# Patient Record
Sex: Male | Born: 1955 | Race: White | Hispanic: No | Marital: Married | Smoking: Never smoker
Health system: Southern US, Community
[De-identification: ages and names within clinical notes are randomized; demographics above are authoritative.]

## PROBLEM LIST (undated history)

## (undated) DIAGNOSIS — Z87442 Personal history of urinary calculi: Secondary | ICD-10-CM

## (undated) DIAGNOSIS — M1990 Unspecified osteoarthritis, unspecified site: Secondary | ICD-10-CM

## (undated) DIAGNOSIS — T7840XA Allergy, unspecified, initial encounter: Secondary | ICD-10-CM

## (undated) DIAGNOSIS — K219 Gastro-esophageal reflux disease without esophagitis: Secondary | ICD-10-CM

## (undated) DIAGNOSIS — K859 Acute pancreatitis without necrosis or infection, unspecified: Secondary | ICD-10-CM

## (undated) DIAGNOSIS — T8859XA Other complications of anesthesia, initial encounter: Secondary | ICD-10-CM

## (undated) DIAGNOSIS — I10 Essential (primary) hypertension: Secondary | ICD-10-CM

## (undated) DIAGNOSIS — C4490 Unspecified malignant neoplasm of skin, unspecified: Secondary | ICD-10-CM

## (undated) DIAGNOSIS — G500 Trigeminal neuralgia: Secondary | ICD-10-CM

## (undated) DIAGNOSIS — R519 Headache, unspecified: Secondary | ICD-10-CM

## (undated) HISTORY — DX: Unspecified osteoarthritis, unspecified site: M19.90

## (undated) HISTORY — PX: BUNIONECTOMY: SHX129

## (undated) HISTORY — PX: POSTERIOR FUSION CERVICAL SPINE: SUR628

## (undated) HISTORY — PX: KNEE SURGERY: SHX244

## (undated) HISTORY — DX: Allergy, unspecified, initial encounter: T78.40XA

## (undated) HISTORY — PX: EXCISION MORTON'S NEUROMA: SHX5013

## (undated) HISTORY — PX: SPINE SURGERY: SHX786

## (undated) HISTORY — PX: NECK SURGERY: SHX720

---

## 2000-04-10 ENCOUNTER — Emergency Department (HOSPITAL_COMMUNITY): Admission: EM | Admit: 2000-04-10 | Discharge: 2000-04-10 | Payer: Self-pay | Admitting: Emergency Medicine

## 2000-07-22 ENCOUNTER — Ambulatory Visit (HOSPITAL_COMMUNITY): Admission: RE | Admit: 2000-07-22 | Discharge: 2000-07-22 | Payer: Self-pay | Admitting: Orthopedic Surgery

## 2000-07-22 ENCOUNTER — Encounter: Payer: Self-pay | Admitting: Orthopedic Surgery

## 2000-08-04 ENCOUNTER — Ambulatory Visit (HOSPITAL_COMMUNITY): Admission: RE | Admit: 2000-08-04 | Discharge: 2000-08-04 | Payer: Self-pay | Admitting: Orthopedic Surgery

## 2000-08-04 ENCOUNTER — Encounter: Payer: Self-pay | Admitting: Orthopedic Surgery

## 2000-08-18 ENCOUNTER — Encounter: Payer: Self-pay | Admitting: Orthopedic Surgery

## 2000-08-18 ENCOUNTER — Ambulatory Visit (HOSPITAL_COMMUNITY): Admission: RE | Admit: 2000-08-18 | Discharge: 2000-08-18 | Payer: Self-pay | Admitting: Orthopedic Surgery

## 2001-05-29 ENCOUNTER — Encounter: Admission: RE | Admit: 2001-05-29 | Discharge: 2001-05-29 | Payer: Self-pay | Admitting: Orthopedic Surgery

## 2001-05-29 ENCOUNTER — Encounter: Payer: Self-pay | Admitting: Orthopedic Surgery

## 2001-08-15 ENCOUNTER — Encounter: Payer: Self-pay | Admitting: Orthopedic Surgery

## 2001-08-15 ENCOUNTER — Encounter: Admission: RE | Admit: 2001-08-15 | Discharge: 2001-08-15 | Payer: Self-pay | Admitting: Orthopedic Surgery

## 2001-09-04 ENCOUNTER — Encounter: Payer: Self-pay | Admitting: Orthopedic Surgery

## 2001-09-04 ENCOUNTER — Encounter: Admission: RE | Admit: 2001-09-04 | Discharge: 2001-09-04 | Payer: Self-pay | Admitting: Orthopedic Surgery

## 2001-10-17 ENCOUNTER — Encounter: Admission: RE | Admit: 2001-10-17 | Discharge: 2001-10-17 | Payer: Self-pay | Admitting: Orthopedic Surgery

## 2001-10-17 ENCOUNTER — Encounter: Payer: Self-pay | Admitting: Orthopedic Surgery

## 2002-06-28 ENCOUNTER — Encounter: Admission: RE | Admit: 2002-06-28 | Discharge: 2002-06-28 | Payer: Self-pay | Admitting: Radiology

## 2002-06-28 ENCOUNTER — Encounter: Payer: Self-pay | Admitting: Radiology

## 2002-07-12 ENCOUNTER — Encounter: Admission: RE | Admit: 2002-07-12 | Discharge: 2002-07-12 | Payer: Self-pay | Admitting: Radiology

## 2002-07-12 ENCOUNTER — Encounter: Payer: Self-pay | Admitting: Radiology

## 2002-11-06 ENCOUNTER — Encounter: Payer: Self-pay | Admitting: Neurosurgery

## 2002-11-06 ENCOUNTER — Ambulatory Visit (HOSPITAL_COMMUNITY): Admission: RE | Admit: 2002-11-06 | Discharge: 2002-11-07 | Payer: Self-pay | Admitting: Neurosurgery

## 2013-09-06 ENCOUNTER — Other Ambulatory Visit: Payer: Self-pay | Admitting: Family Medicine

## 2013-09-06 ENCOUNTER — Ambulatory Visit
Admission: RE | Admit: 2013-09-06 | Discharge: 2013-09-06 | Disposition: A | Discharge status: Home / Self Care | Payer: BC Managed Care – PPO | Source: Ambulatory Visit | Attending: Family Medicine | Admitting: Family Medicine

## 2013-09-06 DIAGNOSIS — M542 Cervicalgia: Secondary | ICD-10-CM

## 2013-09-06 DIAGNOSIS — M79601 Pain in right arm: Secondary | ICD-10-CM

## 2014-03-22 ENCOUNTER — Other Ambulatory Visit: Payer: Self-pay | Admitting: Neurosurgery

## 2014-03-22 DIAGNOSIS — M5412 Radiculopathy, cervical region: Secondary | ICD-10-CM

## 2014-03-25 ENCOUNTER — Other Ambulatory Visit: Payer: Self-pay | Admitting: Neurosurgery

## 2014-03-25 DIAGNOSIS — M5412 Radiculopathy, cervical region: Secondary | ICD-10-CM

## 2015-08-13 ENCOUNTER — Encounter (HOSPITAL_BASED_OUTPATIENT_CLINIC_OR_DEPARTMENT_OTHER): Payer: Self-pay | Admitting: *Deleted

## 2015-08-13 ENCOUNTER — Emergency Department (HOSPITAL_BASED_OUTPATIENT_CLINIC_OR_DEPARTMENT_OTHER): Payer: BLUE CROSS/BLUE SHIELD

## 2015-08-13 ENCOUNTER — Inpatient Hospital Stay (HOSPITAL_BASED_OUTPATIENT_CLINIC_OR_DEPARTMENT_OTHER)
Admission: EM | Admit: 2015-08-13 | Discharge: 2015-08-17 | DRG: 439 | Disposition: A | Discharge status: Home / Self Care | Payer: BLUE CROSS/BLUE SHIELD | Attending: Internal Medicine | Admitting: Internal Medicine

## 2015-08-13 DIAGNOSIS — M62830 Muscle spasm of back: Secondary | ICD-10-CM | POA: Diagnosis present

## 2015-08-13 DIAGNOSIS — K219 Gastro-esophageal reflux disease without esophagitis: Secondary | ICD-10-CM | POA: Diagnosis present

## 2015-08-13 DIAGNOSIS — R739 Hyperglycemia, unspecified: Secondary | ICD-10-CM | POA: Diagnosis present

## 2015-08-13 DIAGNOSIS — Z981 Arthrodesis status: Secondary | ICD-10-CM

## 2015-08-13 DIAGNOSIS — Z881 Allergy status to other antibiotic agents status: Secondary | ICD-10-CM

## 2015-08-13 DIAGNOSIS — M109 Gout, unspecified: Secondary | ICD-10-CM | POA: Diagnosis present

## 2015-08-13 DIAGNOSIS — I10 Essential (primary) hypertension: Secondary | ICD-10-CM | POA: Diagnosis present

## 2015-08-13 DIAGNOSIS — K859 Acute pancreatitis without necrosis or infection, unspecified: Secondary | ICD-10-CM | POA: Diagnosis not present

## 2015-08-13 DIAGNOSIS — Z85828 Personal history of other malignant neoplasm of skin: Secondary | ICD-10-CM

## 2015-08-13 DIAGNOSIS — K769 Liver disease, unspecified: Secondary | ICD-10-CM | POA: Diagnosis present

## 2015-08-13 DIAGNOSIS — E871 Hypo-osmolality and hyponatremia: Secondary | ICD-10-CM | POA: Diagnosis present

## 2015-08-13 DIAGNOSIS — Z880 Allergy status to penicillin: Secondary | ICD-10-CM

## 2015-08-13 DIAGNOSIS — R252 Cramp and spasm: Secondary | ICD-10-CM | POA: Diagnosis present

## 2015-08-13 HISTORY — DX: Unspecified malignant neoplasm of skin, unspecified: C44.90

## 2015-08-13 HISTORY — DX: Essential (primary) hypertension: I10

## 2015-08-13 HISTORY — DX: Gastro-esophageal reflux disease without esophagitis: K21.9

## 2015-08-13 LAB — CBC WITH DIFFERENTIAL/PLATELET
BASOS PCT: 0 %
Basophils Absolute: 0 10*3/uL (ref 0.0–0.1)
EOS ABS: 0.5 10*3/uL (ref 0.0–0.7)
Eosinophils Relative: 4 %
HCT: 44.1 % (ref 39.0–52.0)
HEMOGLOBIN: 14.9 g/dL (ref 13.0–17.0)
Lymphocytes Relative: 21 %
Lymphs Abs: 2.3 10*3/uL (ref 0.7–4.0)
MCH: 30.3 pg (ref 26.0–34.0)
MCHC: 33.8 g/dL (ref 30.0–36.0)
MCV: 89.8 fL (ref 78.0–100.0)
Monocytes Absolute: 0.9 10*3/uL (ref 0.1–1.0)
Monocytes Relative: 8 %
NEUTROS PCT: 67 %
Neutro Abs: 7.3 10*3/uL (ref 1.7–7.7)
Platelets: 205 10*3/uL (ref 150–400)
RBC: 4.91 MIL/uL (ref 4.22–5.81)
RDW: 13.1 % (ref 11.5–15.5)
WBC: 11 10*3/uL — AB (ref 4.0–10.5)

## 2015-08-13 LAB — COMPREHENSIVE METABOLIC PANEL
ALK PHOS: 75 U/L (ref 38–126)
ALT: 23 U/L (ref 17–63)
ANION GAP: 7 (ref 5–15)
AST: 24 U/L (ref 15–41)
Albumin: 4.3 g/dL (ref 3.5–5.0)
BILIRUBIN TOTAL: 1.2 mg/dL (ref 0.3–1.2)
BUN: 21 mg/dL — ABNORMAL HIGH (ref 6–20)
CALCIUM: 8.9 mg/dL (ref 8.9–10.3)
CO2: 26 mmol/L (ref 22–32)
Chloride: 99 mmol/L — ABNORMAL LOW (ref 101–111)
Creatinine, Ser: 1.07 mg/dL (ref 0.61–1.24)
Glucose, Bld: 144 mg/dL — ABNORMAL HIGH (ref 65–99)
Potassium: 3.3 mmol/L — ABNORMAL LOW (ref 3.5–5.1)
SODIUM: 132 mmol/L — AB (ref 135–145)
TOTAL PROTEIN: 7 g/dL (ref 6.5–8.1)

## 2015-08-13 LAB — LIPASE, BLOOD: LIPASE: 976 U/L — AB (ref 11–51)

## 2015-08-13 LAB — ETHANOL

## 2015-08-13 MED ORDER — FENTANYL CITRATE (PF) 100 MCG/2ML IJ SOLN
50.0000 ug | Freq: Once | INTRAMUSCULAR | Status: AC
  Administered 2015-08-13: 50 ug via INTRAVENOUS
  Filled 2015-08-13: qty 2

## 2015-08-13 MED ORDER — ONDANSETRON HCL 4 MG/2ML IJ SOLN
4.0000 mg | Freq: Once | INTRAMUSCULAR | Status: AC
  Administered 2015-08-13: 4 mg via INTRAVENOUS
  Filled 2015-08-13: qty 2

## 2015-08-13 MED ORDER — SODIUM CHLORIDE 0.9 % IV BOLUS (SEPSIS)
500.0000 mL | Freq: Once | INTRAVENOUS | Status: AC
Start: 2015-08-13 — End: 2015-08-13
  Administered 2015-08-13: 500 mL via INTRAVENOUS

## 2015-08-13 NOTE — ED Notes (Signed)
Pt ambulated to restroom with no assistance needed.  Pt c/o pain , no acute distress noted

## 2015-08-13 NOTE — ED Notes (Signed)
MD at bedside. 

## 2015-08-13 NOTE — ED Provider Notes (Signed)
CSN: [id number]     Arrival date & time 08/13/15  2016 History   By signing my name below, I, Erling Conte, attest that this documentation has been prepared under the direction and in the presence of Davonna Belling, MD. Electronically Signed: Erling Conte, ED Scribe. 08/14/2015. 12:08 AM.    Chief Complaint  Patient presents with  . Chest Pain   The history is provided by the patient. No language interpreter was used.    HPI Comments: Corey Hawkins is a 60 y.o. male with a h/o GERD who presents to the Emergency Department complaining of intermittent, moderate, sharp, left sided chest pain onset yesterday that began to gradually worsen today. Pt notes the pain radiates to his back. He reports associated nausea, LUQ abdominal pain, abdominal distension and dry cough. He has not taken anything prior to arrival for his symptoms. Pt states the pain is exacerbated with eating and certain movements. He denies any h/o gallstones. He notes he drinks 2-4x per week but drinks light beer. He denies any h/o ETOH abuse. He denies any fevers, chills, vomiting, diarrhea or trouble breathing    Past Medical History  Diagnosis Date  . GERD (gastroesophageal reflux disease)   . Hypertension   . Skin cancer    Past Surgical History  Procedure Laterality Date  . Knee surgery    . Neck surgery     No family history on file. Social History  Substance Use Topics  . Smoking status: Passive Smoke Exposure - Never Smoker  . Smokeless tobacco: Never Used  . Alcohol Use: Yes     Comment: 2-3 drinks; 4-5 days a week    Review of Systems  Constitutional: Negative for fever.  Respiratory: Negative for shortness of breath.   Cardiovascular: Positive for chest pain.  Gastrointestinal: Positive for nausea, abdominal pain and abdominal distention. Negative for vomiting and diarrhea.      Allergies  Vibramycin and Penicillins  Home Medications   Prior to Admission medications   Medication Sig  Start Date End Date Taking? Authorizing Provider  ALLOPURINOL PO Take by mouth daily.   Yes Historical Provider, MD  co-enzyme Q-10 30 MG capsule Take by mouth.   Yes Historical Provider, MD  lisinopril (PRINIVIL,ZESTRIL) 2.5 MG tablet Take 2.5 mg by mouth daily.   Yes Historical Provider, MD  Omega-3 Fatty Acids (FISH OIL PO) Take by mouth.   Yes Historical Provider, MD  omeprazole (PRILOSEC) 20 MG capsule Take 20 mg by mouth daily.   Yes Historical Provider, MD   Triage Vitals: BP 114/78 mmHg  Pulse 67  Temp(Src) 98.1 F (36.7 C) (Oral)  Resp 20  Ht 5\' 10"  (1.778 m)  Wt 211 lb (95.709 kg)  BMI 30.28 kg/m2  SpO2 98%  Physical Exam  Constitutional: He is oriented to person, place, and time. He appears well-developed and well-nourished. No distress.  HENT:  Head: Normocephalic and atraumatic.  Eyes: Conjunctivae and EOM are normal.  Neck: Neck supple. No tracheal deviation present.  Cardiovascular: Normal rate, regular rhythm and normal heart sounds.   Pulmonary/Chest: Effort normal. No respiratory distress.  Abdominal: Soft. He exhibits no distension and no mass. There is tenderness in the epigastric area and left upper quadrant. There is CVA tenderness (mild posterior to mid back tenderness). There is no rebound and no guarding.  Musculoskeletal: Normal range of motion.  Neurological: He is alert and oriented to person, place, and time.  Skin: Skin is warm and dry.  Psychiatric: He has  a normal mood and affect. His behavior is normal.  Nursing note and vitals reviewed.   ED Course  Procedures (including critical care time)  Results for orders placed or performed during the hospital encounter of 08/13/15  Comprehensive metabolic panel  Result Value Ref Range   Sodium 132 (L) 135 - 145 mmol/L   Potassium 3.3 (L) 3.5 - 5.1 mmol/L   Chloride 99 (L) 101 - 111 mmol/L   CO2 26 22 - 32 mmol/L   Glucose, Bld 144 (H) 65 - 99 mg/dL   BUN 21 (H) 6 - 20 mg/dL   Creatinine, Ser 1.07  0.61 - 1.24 mg/dL   Calcium 8.9 8.9 - 10.3 mg/dL   Total Protein 7.0 6.5 - 8.1 g/dL   Albumin 4.3 3.5 - 5.0 g/dL   AST 24 15 - 41 U/L   ALT 23 17 - 63 U/L   Alkaline Phosphatase 75 38 - 126 U/L   Total Bilirubin 1.2 0.3 - 1.2 mg/dL   GFR calc non Af Amer >60 >60 mL/min   GFR calc Af Amer >60 >60 mL/min   Anion gap 7 5 - 15  Lipase, blood  Result Value Ref Range   Lipase 976 (H) 11 - 51 U/L  CBC with Differential  Result Value Ref Range   WBC 11.0 (H) 4.0 - 10.5 K/uL   RBC 4.91 4.22 - 5.81 MIL/uL   Hemoglobin 14.9 13.0 - 17.0 g/dL   HCT 44.1 39.0 - 52.0 %   MCV 89.8 78.0 - 100.0 fL   MCH 30.3 26.0 - 34.0 pg   MCHC 33.8 30.0 - 36.0 g/dL   RDW 13.1 11.5 - 15.5 %   Platelets 205 150 - 400 K/uL   Neutrophils Relative % 67 %   Neutro Abs 7.3 1.7 - 7.7 K/uL   Lymphocytes Relative 21 %   Lymphs Abs 2.3 0.7 - 4.0 K/uL   Monocytes Relative 8 %   Monocytes Absolute 0.9 0.1 - 1.0 K/uL   Eosinophils Relative 4 %   Eosinophils Absolute 0.5 0.0 - 0.7 K/uL   Basophils Relative 0 %   Basophils Absolute 0.0 0.0 - 0.1 K/uL  Ethanol  Result Value Ref Range   Alcohol, Ethyl (B) <5 <5 mg/dL   Dg Chest 2 View  08/13/2015  CLINICAL DATA:  Acute onset of left-sided lower chest pain and shortness of breath. Initial encounter. EXAM: CHEST  2 VIEW COMPARISON:  None. FINDINGS: The lungs are well-aerated and clear. There is no evidence of focal opacification, pleural effusion or pneumothorax. The heart is normal in size; the mediastinal contour is within normal limits. No acute osseous abnormalities are seen. Cervical spinal fusion hardware is noted. IMPRESSION: No acute cardiopulmonary process seen. Electronically Signed   By: Garald Balding M.D.   On: 08/13/2015 22:16    I have personally reviewed and evaluated these images and lab results as part of my medical decision-making.   EKG Interpretation   Date/Time:  Wednesday August 13 2015 20:22:45 EST Ventricular Rate:  73 PR Interval:  156 QRS  Duration: 84 QT Interval:  400 QTC Calculation: 440 R Axis:   -14 Text Interpretation:  Normal sinus rhythm Nonspecific ST and T wave  abnormality Abnormal ECG Confirmed by Alvino Chapel  MD, Ovid Curd [phone]) on  08/13/2015 9:00:05 PM      MDM   Final diagnoses:  Acute pancreatitis, unspecified pancreatitis type    Patient with abdominal pain with radiation to chest. NV. New onset pancreatitis. Likely  has had previous episodes. Continued pain. Will admit.  I personally performed the services described in this documentation, which was scribed in my presence. The recorded information has been reviewed and is accurate.       Davonna Belling, MD 08/14/15 [phone]

## 2015-08-13 NOTE — ED Notes (Signed)
Pt reports sharp left side chest pain since yesterday morning; worse today; nausea; feels like "knot in throat"

## 2015-08-14 ENCOUNTER — Inpatient Hospital Stay (HOSPITAL_COMMUNITY): Payer: BLUE CROSS/BLUE SHIELD

## 2015-08-14 ENCOUNTER — Inpatient Hospital Stay (HOSPITAL_BASED_OUTPATIENT_CLINIC_OR_DEPARTMENT_OTHER): Payer: BLUE CROSS/BLUE SHIELD

## 2015-08-14 ENCOUNTER — Encounter (HOSPITAL_COMMUNITY): Payer: Self-pay

## 2015-08-14 DIAGNOSIS — R739 Hyperglycemia, unspecified: Secondary | ICD-10-CM | POA: Diagnosis present

## 2015-08-14 DIAGNOSIS — M109 Gout, unspecified: Secondary | ICD-10-CM

## 2015-08-14 DIAGNOSIS — K7689 Other specified diseases of liver: Secondary | ICD-10-CM | POA: Diagnosis not present

## 2015-08-14 DIAGNOSIS — K219 Gastro-esophageal reflux disease without esophagitis: Secondary | ICD-10-CM | POA: Diagnosis present

## 2015-08-14 DIAGNOSIS — E871 Hypo-osmolality and hyponatremia: Secondary | ICD-10-CM

## 2015-08-14 DIAGNOSIS — I10 Essential (primary) hypertension: Secondary | ICD-10-CM | POA: Diagnosis present

## 2015-08-14 DIAGNOSIS — R252 Cramp and spasm: Secondary | ICD-10-CM | POA: Diagnosis present

## 2015-08-14 DIAGNOSIS — K769 Liver disease, unspecified: Secondary | ICD-10-CM | POA: Diagnosis present

## 2015-08-14 DIAGNOSIS — Z85828 Personal history of other malignant neoplasm of skin: Secondary | ICD-10-CM | POA: Diagnosis not present

## 2015-08-14 DIAGNOSIS — Z981 Arthrodesis status: Secondary | ICD-10-CM | POA: Diagnosis not present

## 2015-08-14 DIAGNOSIS — M62830 Muscle spasm of back: Secondary | ICD-10-CM | POA: Diagnosis not present

## 2015-08-14 DIAGNOSIS — K859 Acute pancreatitis without necrosis or infection, unspecified: Secondary | ICD-10-CM | POA: Diagnosis present

## 2015-08-14 DIAGNOSIS — Z880 Allergy status to penicillin: Secondary | ICD-10-CM | POA: Diagnosis not present

## 2015-08-14 DIAGNOSIS — Z881 Allergy status to other antibiotic agents status: Secondary | ICD-10-CM | POA: Diagnosis not present

## 2015-08-14 MED ORDER — THIAMINE HCL 100 MG/ML IJ SOLN
100.0000 mg | Freq: Every day | INTRAMUSCULAR | Status: DC
  Administered 2015-08-15: 100 mg via INTRAVENOUS
  Filled 2015-08-14: qty 2

## 2015-08-14 MED ORDER — HYDROMORPHONE HCL 1 MG/ML IJ SOLN
1.0000 mg | INTRAMUSCULAR | Status: DC | PRN
  Administered 2015-08-14: 1 mg via INTRAVENOUS
  Filled 2015-08-14: qty 1

## 2015-08-14 MED ORDER — SODIUM CHLORIDE 0.9 % IV SOLN
INTRAVENOUS | Status: DC
  Administered 2015-08-14 – 2015-08-15 (×2): via INTRAVENOUS

## 2015-08-14 MED ORDER — HYDROMORPHONE HCL 1 MG/ML IJ SOLN
1.0000 mg | Freq: Once | INTRAMUSCULAR | Status: AC
  Administered 2015-08-14: 1 mg via INTRAVENOUS
  Filled 2015-08-14: qty 1

## 2015-08-14 MED ORDER — ADULT MULTIVITAMIN W/MINERALS CH: 1.0000 | ORAL_TABLET | Freq: Every day | ORAL | Status: DC

## 2015-08-14 MED ORDER — HYDRALAZINE HCL 20 MG/ML IJ SOLN: 5.0000 mg | INTRAMUSCULAR | Status: DC | PRN

## 2015-08-14 MED ORDER — PANTOPRAZOLE SODIUM 40 MG IV SOLR
40.0000 mg | Freq: Two times a day (BID) | INTRAVENOUS | Status: DC
  Administered 2015-08-14 – 2015-08-15 (×3): 40 mg via INTRAVENOUS
  Filled 2015-08-14 (×3): qty 40

## 2015-08-14 MED ORDER — IOHEXOL 300 MG/ML  SOLN
25.0000 mL | Freq: Once | INTRAMUSCULAR | Status: AC | PRN
  Administered 2015-08-14: 25 mL via ORAL

## 2015-08-14 MED ORDER — DEXTROSE-NACL 5-0.45 % IV SOLN
INTRAVENOUS | Status: AC
  Administered 2015-08-14: 06:00:00 via INTRAVENOUS

## 2015-08-14 MED ORDER — FOLIC ACID 1 MG PO TABS
1.0000 mg | ORAL_TABLET | Freq: Every day | ORAL | Status: DC
  Administered 2015-08-16: 1 mg via ORAL
  Filled 2015-08-14: qty 1

## 2015-08-14 MED ORDER — PROMETHAZINE HCL 25 MG PO TABS: 12.5000 mg | ORAL_TABLET | Freq: Four times a day (QID) | ORAL | Status: DC | PRN

## 2015-08-14 MED ORDER — PANTOPRAZOLE SODIUM 40 MG IV SOLR
40.0000 mg | INTRAVENOUS | Status: DC
  Administered 2015-08-14: 40 mg via INTRAVENOUS
  Filled 2015-08-14 (×2): qty 40

## 2015-08-14 MED ORDER — HYDROMORPHONE HCL 1 MG/ML IJ SOLN
1.0000 mg | INTRAMUSCULAR | Status: DC | PRN
  Administered 2015-08-14 (×3): 1 mg via INTRAVENOUS
  Administered 2015-08-15 (×2): 0.5 mg via INTRAVENOUS
  Administered 2015-08-15: 1 mg via INTRAVENOUS
  Administered 2015-08-15 (×2): 0.5 mg via INTRAVENOUS
  Administered 2015-08-15 – 2015-08-16 (×3): 1 mg via INTRAVENOUS
  Filled 2015-08-14 (×7): qty 1

## 2015-08-14 MED ORDER — GI COCKTAIL ~~LOC~~: 30.0000 mL | Freq: Two times a day (BID) | ORAL | Status: DC | PRN

## 2015-08-14 MED ORDER — LORAZEPAM 1 MG PO TABS: 1.0000 mg | ORAL_TABLET | Freq: Four times a day (QID) | ORAL | Status: DC | PRN

## 2015-08-14 MED ORDER — IOHEXOL 300 MG/ML  SOLN
100.0000 mL | Freq: Once | INTRAMUSCULAR | Status: AC | PRN
  Administered 2015-08-14: 100 mL via INTRAVENOUS

## 2015-08-14 MED ORDER — ONDANSETRON HCL 4 MG/2ML IJ SOLN
8.0000 mg | Freq: Once | INTRAMUSCULAR | Status: AC
  Administered 2015-08-14: 8 mg via INTRAVENOUS
  Filled 2015-08-14: qty 4

## 2015-08-14 MED ORDER — LORAZEPAM 2 MG/ML IJ SOLN: 1.0000 mg | Freq: Four times a day (QID) | INTRAMUSCULAR | Status: DC | PRN

## 2015-08-14 MED ORDER — VITAMIN B-1 100 MG PO TABS
100.0000 mg | ORAL_TABLET | Freq: Every day | ORAL | Status: DC
  Administered 2015-08-16: 100 mg via ORAL
  Filled 2015-08-14: qty 1

## 2015-08-14 MED ORDER — ENOXAPARIN SODIUM 40 MG/0.4ML ~~LOC~~ SOLN
40.0000 mg | SUBCUTANEOUS | Status: DC
  Administered 2015-08-14 – 2015-08-16 (×3): 40 mg via SUBCUTANEOUS
  Filled 2015-08-14 (×3): qty 0.4

## 2015-08-14 NOTE — Plan of Care (Signed)
60 year old male with history of Hypertension presents with abdominal pain. Has elevated lipase. Has acute pancreatitis. Cause ? Alcohol. CT pending.  Corey Hawkins.

## 2015-08-14 NOTE — ED Notes (Signed)
Attempted to call report, needs to call me back

## 2015-08-14 NOTE — Progress Notes (Signed)
Paged MD for admission orders. Payton Emerald, RN

## 2015-08-14 NOTE — Progress Notes (Signed)
Patient arrived via [facility]. Oriented to room, not in any distress at this time. Is requesting Dilaudid to "stay ahead of the pain." Oriented to room, patient placed on heart monitor, Dr notified of patient's arrival, call light within reach.

## 2015-08-14 NOTE — H&P (Addendum)
[facility] History and Physical  LAKIN RAMSOUR [id number] DOB: Aug 05, 1956 DOA: 08/13/2015  Referring physician: Dr. Corrie Dandy PCP: Cleotis Nipper, MD   Chief Complaint: CP   HPI: Corey Hawkins is a 60 y.o. male  Upper abdominal and lower chest pain. Sharp. Radiates to the back. Initially intermittent but now constant. Started 1-2 days ago. Associated with nausea, dry cough and abdominal distention. Patient is not tried anything for her symptoms. Pain is getting worse. Worse with eating and certain movements. No history of gallstones. Drinks approximately 4 nights per week and will drink 2-3 beers on each of those nights. Denies any cardiac type chest pain, palpitations, shortness of breath, LOC, neck stiffness, fevers, dysuria, constipation, diarrhea.  Review of Systems:  Constitutional:  No weight loss, night sweats, Fevers,.  HEENT:  No headaches, Difficulty swallowing,Tooth/dental problems,Sore throat, Cardio-vascular:  No chest pain, Orthopnea, PND, swelling in lower extremities, anasarca, dizziness, palpitations  GI: Per HPI Resp:   No shortness of breath with exertion or at rest. No excess mucus, no productive cough, No non-productive cough, No coughing up of blood.No change in color of mucus.No wheezing.No chest wall deformity  Skin:  no rash or lesions.  GU:  no dysuria, change in color of urine, no urgency or frequency. No flank pain.  Musculoskeletal:   No joint pain or swelling. No decreased range of motion. No back pain.  Psych:  No change in mood or affect. No depression or anxiety. No memory loss.  Neuro:  No change in sensation, unilateral strength, or cognitive abilities  All other systems were reviewed and are negative.  Past Medical History  Diagnosis Date  . GERD (gastroesophageal reflux disease)   . Hypertension   . Skin cancer    Past Surgical History  Procedure Laterality Date  . Knee surgery    . Neck surgery     Social History:   reports that he has been passively smoking.  He has never used smokeless tobacco. He reports that he drinks alcohol. He reports that he does not use illicit drugs.  Allergies  Allergen Reactions  . Vibramycin [Doxycycline Hyclate] Other (See Comments)    All "mycins"; cause mouth sores  . Penicillins Rash    "all 'cillins"    Family History  Problem Relation Age of Onset  . Gallbladder disease Mother   . Gallbladder disease Father   . Gallbladder disease Brother      Prior to Admission medications   Medication Sig Start Date End Date Taking? Authorizing Provider  ALLOPURINOL PO Take by mouth daily.   Yes Historical Provider, MD  co-enzyme Q-10 30 MG capsule Take by mouth.   Yes Historical Provider, MD  lisinopril (PRINIVIL,ZESTRIL) 2.5 MG tablet Take 2.5 mg by mouth daily.   Yes Historical Provider, MD  Omega-3 Fatty Acids (FISH OIL PO) Take by mouth.   Yes Historical Provider, MD  omeprazole (PRILOSEC) 20 MG capsule Take 20 mg by mouth daily.   Yes Historical Provider, MD   Physical Exam: Filed Vitals:   08/13/15 2300 08/14/15 0008 08/14/15 0030 08/14/15 0230  BP: 127/81 120/73 124/82 129/81  Pulse: 64 66 75 65  Temp:    97.5 F (36.4 C)  TempSrc:    Oral  Resp: 16 18  18   Height:    5\' 11"  (1.803 m)  Weight:    98.3 kg (216 lb 11.4 oz)  SpO2: 98% 98% 94% 95%    Wt Readings from Last 3 Encounters:  08/14/15  98.3 kg (216 lb 11.4 oz)    General:  Appears calm and comfortable Eyes:  PERRL, EOMI, normal lids, iris ENT:  grossly normal hearing, lips & tongue Neck:  no LAD, masses or thyromegaly Cardiovascular:  RRR, no m/r/g. No LE edema.  Respiratory:  CTA bilaterally, no w/r/r. Normal respiratory effort. Abdomen:  Mild diffuse upper abdominal pain, hypoactive bowel sounds, nondistended. Skin:  no rash or induration seen on limited exam Musculoskeletal:  grossly normal tone BUE/BLE Psychiatric:  grossly normal mood and affect, speech fluent and  appropriate Neurologic:  CN 2-12 grossly intact, moves all extremities in coordinated fashion.          Labs on Admission:  Basic Metabolic Panel:  Recent Labs Lab 08/13/15 2200  NA 132*  K 3.3*  CL 99*  CO2 26  GLUCOSE 144*  BUN 21*  CREATININE 1.07  CALCIUM 8.9   Liver Function Tests:  Recent Labs Lab 08/13/15 2200  AST 24  ALT 23  ALKPHOS 75  BILITOT 1.2  PROT 7.0  ALBUMIN 4.3    Recent Labs Lab 08/13/15 2200  LIPASE 976*   No results for input(s): AMMONIA in the last 168 hours. CBC:  Recent Labs Lab 08/13/15 2200  WBC 11.0*  NEUTROABS 7.3  HGB 14.9  HCT 44.1  MCV 89.8  PLT 205   Cardiac Enzymes: No results for input(s): CKTOTAL, CKMB, CKMBINDEX, TROPONINI in the last 168 hours.  BNP (last 3 results) No results for input(s): BNP in the last 8760 hours.  ProBNP (last 3 results) No results for input(s): PROBNP in the last 8760 hours.   CREATININE: 1.07 (08/13/15 2200) Estimated creatinine clearance - 87.7 mL/min  CBG: No results for input(s): GLUCAP in the last 168 hours.  Radiological Exams on Admission: Dg Chest 2 View  08/13/2015  CLINICAL DATA:  Acute onset of left-sided lower chest pain and shortness of breath. Initial encounter. EXAM: CHEST  2 VIEW COMPARISON:  None. FINDINGS: The lungs are well-aerated and clear. There is no evidence of focal opacification, pleural effusion or pneumothorax. The heart is normal in size; the mediastinal contour is within normal limits. No acute osseous abnormalities are seen. Cervical spinal fusion hardware is noted. IMPRESSION: No acute cardiopulmonary process seen. Electronically Signed   By: Garald Balding M.D.   On: 08/13/2015 22:16   Ct Abdomen Pelvis W Contrast  08/14/2015  CLINICAL DATA:  Acute onset of left-sided chest pain and left upper quadrant abdominal pain. Nausea, constipation and elevated lipase. Initial encounter. EXAM: CT ABDOMEN AND PELVIS WITH CONTRAST TECHNIQUE: Multidetector CT imaging  of the abdomen and pelvis was performed using the standard protocol following bolus administration of intravenous contrast. CONTRAST:  119mL OMNIPAQUE IOHEXOL 300 MG/ML  SOLN COMPARISON:  None. FINDINGS: The visualized lung bases are clear. There is a vague 4.0 cm hypodensity within the right hepatic lobe. This is not well characterized on the current study. The liver and spleen are otherwise unremarkable. The gallbladder is within normal limits. The adrenal glands are unremarkable. Mild diffuse soft tissue inflammation is noted about the pancreas, compatible with mild acute pancreatitis. There is some degree of sparing of the pancreatic head, and a small amount of fluid is noted tracking inferiorly along Gerota's fascia on the left side. There is no evidence of devascularization or pseudocyst formation at this time. Minimal nonspecific left-sided perinephric stranding is noted. The kidneys are otherwise unremarkable. There is no evidence of hydronephrosis. No renal or ureteral stones are seen. The small bowel  is unremarkable in appearance. The stomach is within normal limits. No acute vascular abnormalities are seen. Mild calcification is noted along the abdominal aorta and its branches. The appendix is normal in caliber, without evidence of appendicitis. The colon is unremarkable in appearance. The bladder is mildly distended and grossly unremarkable in appearance. The prostate remains normal in size. No inguinal lymphadenopathy is seen. No acute osseous abnormalities are identified. IMPRESSION: 1. Mild acute pancreatitis noted, with mild diffuse soft tissue inflammation about the pancreas. Some sparing of the pancreatic head. Small amount of fluid noted tracking inferiorly along Gerota's fascia on the left side. No evidence of devascularization or pseudocyst formation at this time. 2. Vague 4.0 cm hypodensity within the right hepatic lobe. Dynamic liver protocol MRI or CT would be helpful for further evaluation.  3. Mild calcification along the abdominal aorta and branches. Electronically Signed   By: Garald Balding M.D.   On: 08/14/2015 01:20     Assessment/Plan Active Problems:   Pancreatitis   Hepatic lesion   Essential hypertension   GERD (gastroesophageal reflux disease)   Hyponatremia   Hyperglycemia   Gout   Acute pancreatitis: Lipase 976, WBC 11, AFVSS. ETOH <5. CT showing pancreatitis without pseudocyst and possible hypodensity in the liver without evidence of gallstones or biliary dilatation. Suspect EtOH etiology. Strong family history of Cholecystitis/cholecystectomy - MedSurg - Nothing by mouth - IVF - Pain medications, antiemetics - Right upper quadrant ultrasound - Lipid panel - consider GI/Gen surg consult if not improving - CIWA  Hepatic Lesion: 4cm R lobe hypodencity noted. Radiology recommending MRI but pt unable to have MRI due to cervical fusion surgery - RUQ [place]   HTN: unable to take PO. nml range at this time - hold lisinopril until taking PO - Hydralazine PRN  GERD: on prilosec at home - protonix IV  Hyponatremia: 132. Mild  - IVF - BMEt in am  Hyperglycemia: 144. Mild - A1c  Gout: - continue allopurinol when taking PO  Code Status: FULL  DVT Prophylaxis: Lovenox Family Communication: None Disposition Plan: Pending Improvement    Manaia Samad Lenna Sciara, MD Family Medicine [facility] [email or web address] Password [email or web address]

## 2015-08-14 NOTE — Progress Notes (Signed)
Utilization Review Completed.Corey Hawkins T1/12/2015  

## 2015-08-14 NOTE — ED Notes (Signed)
Patient transported to CT 

## 2015-08-15 DIAGNOSIS — K859 Acute pancreatitis without necrosis or infection, unspecified: Principal | ICD-10-CM

## 2015-08-15 DIAGNOSIS — I10 Essential (primary) hypertension: Secondary | ICD-10-CM

## 2015-08-15 DIAGNOSIS — K7689 Other specified diseases of liver: Secondary | ICD-10-CM

## 2015-08-15 LAB — COMPREHENSIVE METABOLIC PANEL
ALBUMIN: 3.2 g/dL — AB (ref 3.5–5.0)
ALK PHOS: 62 U/L (ref 38–126)
ALT: 16 U/L — ABNORMAL LOW (ref 17–63)
ANION GAP: 7 (ref 5–15)
AST: 17 U/L (ref 15–41)
BILIRUBIN TOTAL: 1.8 mg/dL — AB (ref 0.3–1.2)
BUN: 9 mg/dL (ref 6–20)
CALCIUM: 8.4 mg/dL — AB (ref 8.9–10.3)
CO2: 28 mmol/L (ref 22–32)
Chloride: 104 mmol/L (ref 101–111)
Creatinine, Ser: 1.02 mg/dL (ref 0.61–1.24)
GFR calc Af Amer: >60 mL/min (ref >60)
GFR calc non Af Amer: >60 mL/min (ref >60)
GLUCOSE: 93 mg/dL (ref 65–99)
Potassium: 3.7 mmol/L (ref 3.5–5.1)
Sodium: 139 mmol/L (ref 135–145)
TOTAL PROTEIN: 5.5 g/dL — AB (ref 6.5–8.1)

## 2015-08-15 LAB — CBC
HEMATOCRIT: 40 % (ref 39.0–52.0)
HEMOGLOBIN: 13.3 g/dL (ref 13.0–17.0)
MCH: 30.5 pg (ref 26.0–34.0)
MCHC: 33.3 g/dL (ref 30.0–36.0)
MCV: 91.7 fL (ref 78.0–100.0)
Platelets: 166 10*3/uL (ref 150–400)
RBC: 4.36 MIL/uL (ref 4.22–5.81)
RDW: 13.1 % (ref 11.5–15.5)
WBC: 12 10*3/uL — ABNORMAL HIGH (ref 4.0–10.5)

## 2015-08-15 LAB — LIPID PANEL
CHOL/HDL RATIO: 2.6 ratio
Cholesterol: 152 mg/dL (ref 0–200)
HDL: 58 mg/dL (ref >40)
LDL CALC: 80 mg/dL (ref 0–99)
Triglycerides: 68 mg/dL (ref <150)
VLDL: 14 mg/dL (ref 0–40)

## 2015-08-15 LAB — LIPASE, BLOOD: LIPASE: 90 U/L — AB (ref 11–51)

## 2015-08-15 NOTE — Progress Notes (Signed)
[facility] PROGRESS NOTE  Corey Hawkins [id number] DOB: January 13, 1956 DOA: 08/13/2015 PCP: Cleotis Nipper, MD  Assessment/Plan:  Active Problems:   Acute Pancreatitis: [place] without stone. TG normal. Drinks a few beers 4 times a week with occasional mixed drink. ?EtOH related? Symptoms slightly improved. Will try clears.   Hepatic lesion: Unable to characterize on abdominal scan or ultrasound. Have left message with Dr. Harley Hallmark office to see whether he can have MRI of the liver. If not, will order elective dedicated liver CAT scan.   Essential hypertension   GERD (gastroesophageal reflux disease)   Hyponatremia resolved   Hyperglycemia resolved   Gout  Code Status:  full Family Communication:  Wife  Disposition Plan:  Home 1-2 days if improved   Consultants:    Procedures:     Antibiotics:    HPI/Subjective:  abdominal pain remains and radiates to mid back, but overall, improved. Thinks he may have had pancreatitis in the past with similar symptoms but never sought medical assistance. Denies history of what he calls "heavy drinking". See above   Objective: Filed Vitals:   08/14/15 2208 08/15/15 0731  BP: 126/67 135/67  Pulse: 89 84  Temp: 99.7 F (37.6 C) 98.6 F (37 C)  Resp: 18 18   No intake or output data in the 24 hours ending 08/15/15 1356 Filed Weights   08/13/15 2027 08/14/15 0230  Weight: 95.709 kg (211 lb) 98.3 kg (216 lb 11.4 oz)    Exam:   General:   comfortable in chair. Alert oriented talkative.  Cardiovascular:  regular rate rhythm without murmurs gallops rubs   Respiratory:  clear to auscultation bilaterally without wheezes rhonchi or rales   Abdomen: Soft, bowel sounds quiet. Minimal epigastric tenderness. No rebound tenderness   Ext:  right hand slightly edematous. No edema elsewhere.   Basic Metabolic Panel:  Recent Labs Lab 08/13/15 2200 08/15/15 0340  NA 132* 139  K 3.3* 3.7  CL 99* 104  CO2 26 28  GLUCOSE 144* 93   BUN 21* 9  CREATININE 1.07 1.02  CALCIUM 8.9 8.4*   Liver Function Tests:  Recent Labs Lab 08/13/15 2200 08/15/15 0340  AST 24 17  ALT 23 16*  ALKPHOS 75 62  BILITOT 1.2 1.8*  PROT 7.0 5.5*  ALBUMIN 4.3 3.2*    Recent Labs Lab 08/13/15 2200  LIPASE 976*   No results for input(s): AMMONIA in the last 168 hours. CBC:  Recent Labs Lab 08/13/15 2200 08/15/15 0340  WBC 11.0* 12.0*  NEUTROABS 7.3  --   HGB 14.9 13.3  HCT 44.1 40.0  MCV 89.8 91.7  PLT 205 166   Cardiac Enzymes: No results for input(s): CKTOTAL, CKMB, CKMBINDEX, TROPONINI in the last 168 hours. BNP (last 3 results) No results for input(s): BNP in the last 8760 hours.  ProBNP (last 3 results) No results for input(s): PROBNP in the last 8760 hours.  CBG: No results for input(s): GLUCAP in the last 168 hours.  No results found for this or any previous visit (from the past 240 hour(s)).   Studies: Dg Chest 2 View  08/13/2015  CLINICAL DATA:  Acute onset of left-sided lower chest pain and shortness of breath. Initial encounter. EXAM: CHEST  2 VIEW COMPARISON:  None. FINDINGS: The lungs are well-aerated and clear. There is no evidence of focal opacification, pleural effusion or pneumothorax. The heart is normal in size; the mediastinal contour is within normal limits. No acute osseous abnormalities are seen. Cervical spinal fusion  hardware is noted. IMPRESSION: No acute cardiopulmonary process seen. Electronically Signed   By: Garald Balding M.D.   On: 08/13/2015 22:16   Ct Abdomen Pelvis W Contrast  08/14/2015  CLINICAL DATA:  Acute onset of left-sided chest pain and left upper quadrant abdominal pain. Nausea, constipation and elevated lipase. Initial encounter. EXAM: CT ABDOMEN AND PELVIS WITH CONTRAST TECHNIQUE: Multidetector CT imaging of the abdomen and pelvis was performed using the standard protocol following bolus administration of intravenous contrast. CONTRAST:  152mL OMNIPAQUE IOHEXOL 300 MG/ML   SOLN COMPARISON:  None. FINDINGS: The visualized lung bases are clear. There is a vague 4.0 cm hypodensity within the right hepatic lobe. This is not well characterized on the current study. The liver and spleen are otherwise unremarkable. The gallbladder is within normal limits. The adrenal glands are unremarkable. Mild diffuse soft tissue inflammation is noted about the pancreas, compatible with mild acute pancreatitis. There is some degree of sparing of the pancreatic head, and a small amount of fluid is noted tracking inferiorly along Gerota's fascia on the left side. There is no evidence of devascularization or pseudocyst formation at this time. Minimal nonspecific left-sided perinephric stranding is noted. The kidneys are otherwise unremarkable. There is no evidence of hydronephrosis. No renal or ureteral stones are seen. The small bowel is unremarkable in appearance. The stomach is within normal limits. No acute vascular abnormalities are seen. Mild calcification is noted along the abdominal aorta and its branches. The appendix is normal in caliber, without evidence of appendicitis. The colon is unremarkable in appearance. The bladder is mildly distended and grossly unremarkable in appearance. The prostate remains normal in size. No inguinal lymphadenopathy is seen. No acute osseous abnormalities are identified. IMPRESSION: 1. Mild acute pancreatitis noted, with mild diffuse soft tissue inflammation about the pancreas. Some sparing of the pancreatic head. Small amount of fluid noted tracking inferiorly along Gerota's fascia on the left side. No evidence of devascularization or pseudocyst formation at this time. 2. Vague 4.0 cm hypodensity within the right hepatic lobe. Dynamic liver protocol MRI or CT would be helpful for further evaluation. 3. Mild calcification along the abdominal aorta and branches. Electronically Signed   By: Garald Balding M.D.   On: 08/14/2015 01:20   US Abdomen Limited  Ruq  08/14/2015  CLINICAL DATA:  Pancreatitis and liver lesion seen on CT. EXAM: US ABDOMEN LIMITED - RIGHT UPPER QUADRANT COMPARISON:  CT abdomen pelvis August 14, 2015 FINDINGS: Gallbladder: No gallstones or wall thickening visualized. No sonographic Murphy sign noted by sonographer. Common bile duct: Diameter: 4.1 mm Liver: In the right lobe liver, there is an echogenic area measuring 3.1 x 2.3 x 2.3 cm. There is also a hypoechoic area in the right lobe liver measuring 2 x 2.2 x 2.6 cm. This correlates to the previously questioned CT abnormality. IMPRESSION: Hyper and hypoechoic lesions identified in the right lobe liver correlating to the CT finding. Further evaluation with dynamic liver protocol MRI or CT would be helpful. Electronically Signed   By: Abelardo Diesel M.D.   On: 08/14/2015 18:35    Scheduled Meds: . enoxaparin (LOVENOX) injection  40 mg Subcutaneous Q24H  . folic acid  1 mg Oral Daily  . pantoprazole (PROTONIX) IV  40 mg Intravenous Q12H  . thiamine  100 mg Oral Daily   Or  . thiamine  100 mg Intravenous Daily   Continuous Infusions: . sodium chloride 125 mL/hr at 08/15/15 0125    Time spent: 35 minutes  Esvin Hnat  L  [facility] [email or web address], password [facility] 08/15/2015, 1:56 PM  LOS: 1 day

## 2015-08-15 NOTE — Progress Notes (Signed)
Had extensive discussion with patient and wife concerning plan of care.  Patient feels he does not know what is going on and if he should be expecting surgery or what.  I explained that we had spoken of his plan of care at 0900 and that the plan was to rest his bowels, keep him hydrated and advance his diet to clear liquid as needed.  I explained that i could not go over his results of ultrasound but MD would speak to him.  Patient feels that he has not been seen by a doctor.  I explained that he was seen by Dr. Marily Memos on 08/14/15 and patient states he doesn't remember as MD came by when he was sleeping and it was more that 24 hours earlier. Patient and wife aware that Dr. Conley Canal will round during the afternoon after discharging several patients. I explained that if MD needed to be called I would gladly. Pt resting with call bell within reach.  Will continue to monitor. Payton Emerald, RN

## 2015-08-15 NOTE — Progress Notes (Signed)
Patient actually given 0.5 mg doses during day shift for a total of 2 mg on day shift.  I mg vial used and only gave 1/2 dose.  Dose controlled patients pain. Pt resting with call bell within reach.  Will continue to monitor. Payton Emerald, RN

## 2015-08-16 DIAGNOSIS — M62830 Muscle spasm of back: Secondary | ICD-10-CM | POA: Diagnosis present

## 2015-08-16 DIAGNOSIS — K219 Gastro-esophageal reflux disease without esophagitis: Secondary | ICD-10-CM

## 2015-08-16 LAB — LIPASE, BLOOD: LIPASE: 25 U/L (ref 11–51)

## 2015-08-16 MED ORDER — OXYCODONE-ACETAMINOPHEN 5-325 MG PO TABS
1.0000 | ORAL_TABLET | Freq: Four times a day (QID) | ORAL | Status: DC | PRN
Start: 2015-08-16 — End: 2015-08-16

## 2015-08-16 MED ORDER — HYDROCODONE-ACETAMINOPHEN 5-325 MG PO TABS: 1.0000 | ORAL_TABLET | Freq: Four times a day (QID) | ORAL | Status: DC | PRN

## 2015-08-16 MED ORDER — DIAZEPAM 5 MG PO TABS
5.0000 mg | ORAL_TABLET | Freq: Once | ORAL | Status: AC
  Filled 2015-08-16: qty 1

## 2015-08-16 MED ORDER — SODIUM CHLORIDE 0.9 % IV SOLN: 250.0000 mL | INTRAVENOUS | Status: DC | PRN

## 2015-08-16 MED ORDER — ALLOPURINOL 300 MG PO TABS: 300.0000 mg | ORAL_TABLET | Freq: Every day | ORAL | Status: DC

## 2015-08-16 MED ORDER — PANTOPRAZOLE SODIUM 40 MG PO TBEC: 40.0000 mg | DELAYED_RELEASE_TABLET | Freq: Every day | ORAL | Status: DC

## 2015-08-16 MED ORDER — LISINOPRIL 2.5 MG PO TABS: 2.5000 mg | ORAL_TABLET | Freq: Every day | ORAL | Status: DC

## 2015-08-16 MED ORDER — HYDROMORPHONE HCL 2 MG PO TABS
2.0000 mg | ORAL_TABLET | ORAL | Status: DC | PRN
  Administered 2015-08-16: 2 mg via ORAL
  Filled 2015-08-16: qty 1

## 2015-08-16 MED ORDER — SODIUM CHLORIDE 0.9 % IJ SOLN: 3.0000 mL | Freq: Two times a day (BID) | INTRAMUSCULAR | Status: DC

## 2015-08-16 MED ORDER — SODIUM CHLORIDE 0.9 % IJ SOLN: 3.0000 mL | INTRAMUSCULAR | Status: DC | PRN

## 2015-08-16 MED ORDER — DIAZEPAM 5 MG PO TABS
5.0000 mg | ORAL_TABLET | Freq: Three times a day (TID) | ORAL | Status: DC | PRN
  Administered 2015-08-16 – 2015-08-17 (×3): 5 mg via ORAL
  Filled 2015-08-16 (×2): qty 1

## 2015-08-16 NOTE — Progress Notes (Signed)
[facility] PROGRESS NOTE  Corey Hawkins [id number] DOB: 05-Apr-1956 DOA: 08/13/2015 PCP: Simona Huh, MD  Assessment/Plan:  Active Problems:   Acute Pancreatitis: [place] without stone. TG normal. Drinks a few beers 4 times a week with occasional mixed drink. ?EtOH related? Improving. Lipase normal abd pain improved. Advance to solids, change pain meds to po. Saline lock  Back pain: likely spasm. Takes valium prn. Will order    Hepatic lesion: Unable to characterize on abdominal scan or ultrasound. Per Dr. Joya Salm, ok to get MRI. Patient prefers outpt open MRI   Essential hypertension   GERD (gastroesophageal reflux disease)   Hyponatremia resolved   Hyperglycemia resolved   Gout  Code Status:  full Family Communication:  Wife  Disposition Plan:  Home 1-2 days if improved   Consultants:    Procedures:     Antibiotics:    HPI/Subjective:  abdominal pain remains and radiates to mid back, but overall, improved. Thinks he may have had pancreatitis in the past with similar symptoms but never sought medical assistance. Denies history of what he calls "heavy drinking". See above   Objective: Filed Vitals:   08/15/15 2120 08/16/15 0440  BP: 137/78 126/73  Pulse: 85 75  Temp: 99.5 F (37.5 C) 99.1 F (37.3 C)  Resp: 17 16   No intake or output data in the 24 hours ending 08/16/15 1107 Filed Weights   08/13/15 2027 09/10/2015 0230  Weight: 95.709 kg (211 lb) 98.3 kg (216 lb 11.4 oz)    Exam:   General:   comfortable in chair. Alert oriented talkative.  Cardiovascular:  regular rate rhythm without murmurs gallops rubs   Respiratory:  clear to auscultation bilaterally without wheezes rhonchi or rales   Abdomen: Soft, bowel sounds quiet. Minimal epigastric tenderness. No rebound tenderness   Ext:  right hand slightly edematous. No edema elsewhere.   Basic Metabolic Panel:  Recent Labs Lab 08/13/15 2200 08/15/15 0340  NA 132* 139  K 3.3* 3.7  CL 99*  104  CO2 26 28  GLUCOSE 144* 93  BUN 21* 9  CREATININE 1.07 1.02  CALCIUM 8.9 8.4*   Liver Function Tests:  Recent Labs Lab 08/13/15 2200 08/15/15 0340  AST 24 17  ALT 23 16*  ALKPHOS 75 62  BILITOT 1.2 1.8*  PROT 7.0 5.5*  ALBUMIN 4.3 3.2*    Recent Labs Lab 08/13/15 2200 08/15/15 1444 08/16/15 0615  LIPASE 976* 90* 25   No results for input(s): AMMONIA in the last 168 hours. CBC:  Recent Labs Lab 08/13/15 2200 08/15/15 0340  WBC 11.0* 12.0*  NEUTROABS 7.3  --   HGB 14.9 13.3  HCT 44.1 40.0  MCV 89.8 91.7  PLT 205 166   Cardiac Enzymes: No results for input(s): CKTOTAL, CKMB, CKMBINDEX, TROPONINI in the last 168 hours. BNP (last 3 results) No results for input(s): BNP in the last 8760 hours.  ProBNP (last 3 results) No results for input(s): PROBNP in the last 8760 hours.  CBG: No results for input(s): GLUCAP in the last 168 hours.  No results found for this or any previous visit (from the past 240 hour(s)).   Studies: US Abdomen Limited Ruq  09-10-2015  CLINICAL DATA:  Pancreatitis and liver lesion seen on CT. EXAM: US ABDOMEN LIMITED - RIGHT UPPER QUADRANT COMPARISON:  CT abdomen pelvis 10-Sep-2015 FINDINGS: Gallbladder: No gallstones or wall thickening visualized. No sonographic Murphy sign noted by sonographer. Common bile duct: Diameter: 4.1 mm Liver: In the right  lobe liver, there is an echogenic area measuring 3.1 x 2.3 x 2.3 cm. There is also a hypoechoic area in the right lobe liver measuring 2 x 2.2 x 2.6 cm. This correlates to the previously questioned CT abnormality. IMPRESSION: Hyper and hypoechoic lesions identified in the right lobe liver correlating to the CT finding. Further evaluation with dynamic liver protocol MRI or CT would be helpful. Electronically Signed   By: Abelardo Diesel M.D.   On: 08/14/2015 18:35    Scheduled Meds: . enoxaparin (LOVENOX) injection  40 mg Subcutaneous Q24H  . folic acid  1 mg Oral Daily  . pantoprazole  (PROTONIX) IV  40 mg Intravenous Q12H  . thiamine  100 mg Oral Daily   Or  . thiamine  100 mg Intravenous Daily   Continuous Infusions: . sodium chloride 10 mL/hr at 08/15/15 1508    Time spent: 15 minutes  [facility] [email or web address], password [facility] 08/16/2015, 11:07 AM  LOS: 2 days

## 2015-08-16 NOTE — Progress Notes (Signed)
Pt ambulating independently in hall. 

## 2015-08-17 MED ORDER — HYDROMORPHONE HCL 2 MG PO TABS: 2.0000 mg | ORAL_TABLET | ORAL | Status: DC | PRN

## 2015-08-17 MED ORDER — IBUPROFEN 400 MG PO TABS: 400.0000 mg | ORAL_TABLET | Freq: Four times a day (QID) | ORAL | Status: DC | PRN

## 2015-08-17 MED ORDER — DIAZEPAM 5 MG PO TABS: 5.0000 mg | ORAL_TABLET | Freq: Three times a day (TID) | ORAL | Status: AC | PRN

## 2015-08-17 NOTE — Progress Notes (Signed)
Utilization review completed.  

## 2015-08-17 NOTE — Discharge Summary (Signed)
Physician Discharge Summary  Corey Hawkins [id number] DOB: [birth year] DOA: 08/13/2015  PCP: Simona Huh, MD  Admit date: 08/13/2015 Discharge date: 08/17/2015  Time spent: greater than 30 minutes  Recommendations for Outpatient Follow-up:  1. Please schedule elective OPEN MRI of liver to further characterize liver lesion   Discharge Diagnoses:  Active Problems:  acute Pancreatitis: alcohol related v idiopathic   Hepatic lesion: recommend MRI. Patient requests outpatient open MRI.    Essential hypertension   GERD (gastroesophageal reflux disease)   Hyponatremia   Hyperglycemia   Gout   Back spasm   Discharge Condition: stable  Diet recommendation: low fat  Filed Weights   08/13/15 2027 08/14/15 0230  Weight: 95.709 kg (211 lb) 98.3 kg (216 lb 11.4 oz)    History of present illness:  60 y.o. male  Upper abdominal and lower chest pain. Sharp. Radiates to the back. Initially intermittent but now constant. Started 1-2 days ago. Associated with nausea, dry cough and abdominal distention. Patient is not tried anything for her symptoms. Pain is getting worse. Worse with eating and certain movements. No history of gallstones. Drinks approximately 4 nights per week and will drink 2-3 beers on each of those nights. Denies any cardiac type chest pain, palpitations, shortness of breath, LOC, neck stiffness, fevers, dysuria, constipation, diarrhea. Lipase above 900 and CT abd showed vague 4 cm density at right hepatic lobe.   Hospital Course:  Admitted to medsurg, started on bowel rest, IV fluids, pain medication.  US showed no stone, and unable to further characterize liver hypodensity.  Triglycerides normal.  Pain improved. Diet advanced. Gave patient the option of checking inpatient MRI. Patient would like outpatient elective open MRI. As it is Sunday at discharge, will have patient f/u with PCP to arrange. By discharge, lipase normal, no abdominal pain. Tolerating  solids  Procedures:  none  Consultations:  none  Discharge Exam: Filed Vitals:   08/16/15 2216 08/17/15 0544  BP: 108/59 135/71  Pulse: 72 68  Temp: 98.2 F (36.8 C) 98 F (36.7 C)  Resp: 18 18    General: comfortable, a and o Cardiovascular: rRR Respiratory: CTA abd s, nt, nd  Discharge Instructions   Discharge Instructions    Activity as tolerated - No restrictions    Complete by:  As directed      Diet - low sodium heart healthy    Complete by:  As directed           Current Discharge Medication List    START taking these medications   Details  diazepam (VALIUM) 5 MG tablet Take 1 tablet (5 mg total) by mouth every 8 (eight) hours as needed for muscle spasms. Qty: 15 tablet, Refills: 0    HYDROmorphone (DILAUDID) 2 MG tablet Take 1 tablet (2 mg total) by mouth every 4 (four) hours as needed for severe pain. Qty: 15 tablet, Refills: 0      CONTINUE these medications which have NOT CHANGED   Details  allopurinol (ZYLOPRIM) 300 MG tablet Take 300 mg by mouth daily. Refills: 0    co-enzyme Q-10 30 MG capsule Take 30 mg by mouth daily.     lisinopril (PRINIVIL,ZESTRIL) 2.5 MG tablet Take 2.5 mg by mouth daily.    Omega-3 Fatty Acids (FISH OIL PO) Take 1 tablet by mouth daily.     omeprazole (PRILOSEC) 20 MG capsule Take 20 mg by mouth daily.       Allergies  Allergen Reactions  . Vibramycin [Doxycycline Hyclate]  Other (See Comments)    All "mycins"; cause mouth sores  . Penicillins Rash    "all 'cillins" Has patient had a PCN reaction causing immediate rash, facial/tongue/throat swelling, SOB or lightheadedness with hypotension: Yes Has patient had a PCN reaction causing severe rash involving mucus membranes or skin necrosis: No Has patient had a PCN reaction that required hospitalization No Has patient had a PCN reaction occurring within the last 10 years: No If all of the above answers are "NO", then may proceed with Cephalosporin use.     Follow-up Information    Follow up with Simona Huh, MD.   Specialty:  Family Medicine   Why:  to arrange open MRI of the liver   Contact information:   [address] [place] [address] [phone]        The results of significant diagnostics from this hospitalization (including imaging, microbiology, ancillary and laboratory) are listed below for reference.    Significant Diagnostic Studies: Dg Chest 2 View  08/13/2015  CLINICAL DATA:  Acute onset of left-sided lower chest pain and shortness of breath. Initial encounter. EXAM: CHEST  2 VIEW COMPARISON:  None. FINDINGS: The lungs are well-aerated and clear. There is no evidence of focal opacification, pleural effusion or pneumothorax. The heart is normal in size; the mediastinal contour is within normal limits. No acute osseous abnormalities are seen. Cervical spinal fusion hardware is noted. IMPRESSION: No acute cardiopulmonary process seen. Electronically Signed   By: Garald Balding M.D.   On: 08/13/2015 22:16   Ct Abdomen Pelvis W Contrast  08/14/2015  CLINICAL DATA:  Acute onset of left-sided chest pain and left upper quadrant abdominal pain. Nausea, constipation and elevated lipase. Initial encounter. EXAM: CT ABDOMEN AND PELVIS WITH CONTRAST TECHNIQUE: Multidetector CT imaging of the abdomen and pelvis was performed using the standard protocol following bolus administration of intravenous contrast. CONTRAST:  162mL OMNIPAQUE IOHEXOL 300 MG/ML  SOLN COMPARISON:  None. FINDINGS: The visualized lung bases are clear. There is a vague 4.0 cm hypodensity within the right hepatic lobe. This is not well characterized on the current study. The liver and spleen are otherwise unremarkable. The gallbladder is within normal limits. The adrenal glands are unremarkable. Mild diffuse soft tissue inflammation is noted about the pancreas, compatible with mild acute pancreatitis. There is some degree of sparing of the pancreatic  head, and a small amount of fluid is noted tracking inferiorly along Gerota's fascia on the left side. There is no evidence of devascularization or pseudocyst formation at this time. Minimal nonspecific left-sided perinephric stranding is noted. The kidneys are otherwise unremarkable. There is no evidence of hydronephrosis. No renal or ureteral stones are seen. The small bowel is unremarkable in appearance. The stomach is within normal limits. No acute vascular abnormalities are seen. Mild calcification is noted along the abdominal aorta and its branches. The appendix is normal in caliber, without evidence of appendicitis. The colon is unremarkable in appearance. The bladder is mildly distended and grossly unremarkable in appearance. The prostate remains normal in size. No inguinal lymphadenopathy is seen. No acute osseous abnormalities are identified. IMPRESSION: 1. Mild acute pancreatitis noted, with mild diffuse soft tissue inflammation about the pancreas. Some sparing of the pancreatic head. Small amount of fluid noted tracking inferiorly along Gerota's fascia on the left side. No evidence of devascularization or pseudocyst formation at this time. 2. Vague 4.0 cm hypodensity within the right hepatic lobe. Dynamic liver protocol MRI or CT would be helpful for  further evaluation. 3. Mild calcification along the abdominal aorta and branches. Electronically Signed   By: Garald Balding M.D.   On: 08/14/2015 01:20   US Abdomen Limited Ruq  08/14/2015  CLINICAL DATA:  Pancreatitis and liver lesion seen on CT. EXAM: US ABDOMEN LIMITED - RIGHT UPPER QUADRANT COMPARISON:  CT abdomen pelvis August 14, 2015 FINDINGS: Gallbladder: No gallstones or wall thickening visualized. No sonographic Murphy sign noted by sonographer. Common bile duct: Diameter: 4.1 mm Liver: In the right lobe liver, there is an echogenic area measuring 3.1 x 2.3 x 2.3 cm. There is also a hypoechoic area in the right lobe liver measuring 2 x 2.2 x 2.6  cm. This correlates to the previously questioned CT abnormality. IMPRESSION: Hyper and hypoechoic lesions identified in the right lobe liver correlating to the CT finding. Further evaluation with dynamic liver protocol MRI or CT would be helpful. Electronically Signed   By: Abelardo Diesel M.D.   On: 08/14/2015 18:35    Microbiology: No results found for this or any previous visit (from the past 240 hour(s)).   Labs: Basic Metabolic Panel:  Recent Labs Lab 08/13/15 2200 08/15/15 0340  NA 132* 139  K 3.3* 3.7  CL 99* 104  CO2 26 28  GLUCOSE 144* 93  BUN 21* 9  CREATININE 1.07 1.02  CALCIUM 8.9 8.4*   Liver Function Tests:  Recent Labs Lab 08/13/15 2200 08/15/15 0340  AST 24 17  ALT 23 16*  ALKPHOS 75 62  BILITOT 1.2 1.8*  PROT 7.0 5.5*  ALBUMIN 4.3 3.2*    Recent Labs Lab 08/13/15 2200 08/15/15 1444 08/16/15 0615  LIPASE 976* 90* 25   No results for input(s): AMMONIA in the last 168 hours. CBC:  Recent Labs Lab 08/13/15 2200 08/15/15 0340  WBC 11.0* 12.0*  NEUTROABS 7.3  --   HGB 14.9 13.3  HCT 44.1 40.0  MCV 89.8 91.7  PLT 205 166   Cardiac Enzymes: No results for input(s): CKTOTAL, CKMB, CKMBINDEX, TROPONINI in the last 168 hours. BNP: BNP (last 3 results) No results for input(s): BNP in the last 8760 hours.  ProBNP (last 3 results) No results for input(s): PROBNP in the last 8760 hours.  CBG: No results for input(s): GLUCAP in the last 168 hours.     Signed:  Delfina Redwood MD.  [facility] 08/17/2015, 8:37 AM

## 2015-08-17 NOTE — Progress Notes (Signed)
Pt has been discharged. IV and telemetry box were removed. Pt received discharge instructions and all questions were answered. Pt left the floor via ambulation.    Grant Fontana RN, BSN

## 2015-08-17 NOTE — Progress Notes (Signed)
Patient accidentally pulled his iv out this morning, pt currently has no iv access, pt states he may be discharged today and would not like to be stuck at this time. RN will notify oncoming nurse.

## 2015-08-25 ENCOUNTER — Other Ambulatory Visit: Payer: Self-pay | Admitting: Family Medicine

## 2015-08-25 DIAGNOSIS — K7689 Other specified diseases of liver: Secondary | ICD-10-CM

## 2015-09-04 ENCOUNTER — Ambulatory Visit
Admission: RE | Admit: 2015-09-04 | Discharge: 2015-09-04 | Disposition: A | Discharge status: Home / Self Care | Payer: BLUE CROSS/BLUE SHIELD | Source: Ambulatory Visit | Attending: Family Medicine | Admitting: Family Medicine

## 2015-09-04 DIAGNOSIS — K7689 Other specified diseases of liver: Secondary | ICD-10-CM

## 2015-09-04 MED ORDER — GADOBENATE DIMEGLUMINE 529 MG/ML IV SOLN
20.0000 mL | Freq: Once | INTRAVENOUS | Status: AC | PRN
  Administered 2015-09-04: 20 mL via INTRAVENOUS

## 2016-02-25 ENCOUNTER — Other Ambulatory Visit: Payer: Self-pay | Admitting: Family Medicine

## 2016-02-25 DIAGNOSIS — K769 Liver disease, unspecified: Secondary | ICD-10-CM

## 2016-03-15 ENCOUNTER — Ambulatory Visit
Admission: RE | Admit: 2016-03-15 | Discharge: 2016-03-15 | Disposition: A | Discharge status: Home / Self Care | Payer: BLUE CROSS/BLUE SHIELD | Source: Ambulatory Visit | Attending: Family Medicine | Admitting: Family Medicine

## 2016-03-15 DIAGNOSIS — K769 Liver disease, unspecified: Secondary | ICD-10-CM

## 2016-03-15 MED ORDER — GADOXETATE DISODIUM 0.25 MMOL/ML IV SOLN
9.0000 mL | Freq: Once | INTRAVENOUS | Status: AC | PRN
  Administered 2016-03-15: 9 mL via INTRAVENOUS

## 2016-12-13 DIAGNOSIS — L570 Actinic keratosis: Secondary | ICD-10-CM | POA: Diagnosis not present

## 2016-12-13 DIAGNOSIS — D0462 Carcinoma in situ of skin of left upper limb, including shoulder: Secondary | ICD-10-CM | POA: Diagnosis not present

## 2016-12-13 DIAGNOSIS — L820 Inflamed seborrheic keratosis: Secondary | ICD-10-CM | POA: Diagnosis not present

## 2016-12-14 DIAGNOSIS — I10 Essential (primary) hypertension: Secondary | ICD-10-CM | POA: Diagnosis not present

## 2016-12-14 DIAGNOSIS — M109 Gout, unspecified: Secondary | ICD-10-CM | POA: Diagnosis not present

## 2016-12-14 DIAGNOSIS — E7800 Pure hypercholesterolemia, unspecified: Secondary | ICD-10-CM | POA: Diagnosis not present

## 2017-01-10 DIAGNOSIS — L719 Rosacea, unspecified: Secondary | ICD-10-CM | POA: Diagnosis not present

## 2017-06-20 DIAGNOSIS — Z85828 Personal history of other malignant neoplasm of skin: Secondary | ICD-10-CM | POA: Diagnosis not present

## 2017-06-20 DIAGNOSIS — L570 Actinic keratosis: Secondary | ICD-10-CM | POA: Diagnosis not present

## 2017-06-20 DIAGNOSIS — Z08 Encounter for follow-up examination after completed treatment for malignant neoplasm: Secondary | ICD-10-CM | POA: Diagnosis not present

## 2017-11-15 ENCOUNTER — Ambulatory Visit: Payer: 59 | Admitting: Neurology

## 2017-11-15 ENCOUNTER — Encounter: Payer: Self-pay | Admitting: Neurology

## 2017-11-15 VITALS — BP 142/92 | HR 72 | Ht 71.0 in | Wt 213.5 lb

## 2017-11-15 DIAGNOSIS — M792 Neuralgia and neuritis, unspecified: Secondary | ICD-10-CM

## 2017-11-15 NOTE — Progress Notes (Signed)
Reason for visit: Left maxillary discomfort  Referring physician: Dr. Theda Sers is a 62 y.o. male  History of present illness:  Mr. Crissman is a 62 year old right-handed white male with a history of cervical spine disease, with prior cervical spine surgery previously done by Dr. Joya Salm.  The patient is sent to this office for discomfort in the left maxillary area.  The patient indicates that he initially had the discomfort in 2008, he had a tooth pulled which alleviated the pain completely for many years up until 1 year ago.  The same pain has recurred, the patient has had a second tooth pulled, but he still has discomfort in this area.  He notes that he can press on an area of the gum that will bring on discomfort.  If he eats something too hot or too cold he will have pain in this region that spreads into the left cheek area.  He has no numbness of the left face.  The pain may last hours at a time associated with a very strong boring pain.  The patient denies any sharp shooting pains or electric shock sensations.  The process of chewing or talking does not bring on pain.  The patient can localize the area that will initiate the discomfort.  The patient denies any numbness or weakness of the face, arms, or legs.  He denies issues with balance or difficulty with controlling the bowels or the bladder.  He has been placed on gabapentin taking 300 mg 4 times a day and this has all but alleviated his discomfort.  The patient is sent to this office for further evaluation.  Past Medical History:  Diagnosis Date  . GERD (gastroesophageal reflux disease)   . Hypertension   . Skin cancer     Past Surgical History:  Procedure Laterality Date  . KNEE SURGERY    . NECK SURGERY      Family History  Problem Relation Age of Onset  . Gallbladder disease Mother   . Gallbladder disease Father   . Gallbladder disease Brother     Social history:  reports that he is a non-smoker but has been  exposed to tobacco smoke. He has never used smokeless tobacco. He reports that he drinks alcohol. He reports that he does not use drugs.  Medications:  Prior to Admission medications   Medication Sig Start Date End Date Taking? Authorizing Provider  allopurinol (ZYLOPRIM) 300 MG tablet Take 300 mg by mouth daily. 08/11/15  Yes [provider]  co-enzyme Q-10 30 MG capsule Take 30 mg by mouth daily.    Yes [provider]  diazepam (VALIUM) 5 MG tablet Take 1 tablet (5 mg total) by mouth every 8 (eight) hours as needed for muscle spasms. 08/17/15  Yes Delfina Redwood, MD  gabapentin (NEURONTIN) 300 MG capsule Take 300 mg by mouth 4 (four) times daily.   Yes [provider]  HYDROmorphone (DILAUDID) 2 MG tablet Take 1 tablet (2 mg total) by mouth every 4 (four) hours as needed for severe pain. 08/17/15  Yes Delfina Redwood, MD  lisinopril (PRINIVIL,ZESTRIL) 2.5 MG tablet Take 2.5 mg by mouth daily.   Yes [provider]  Omega-3 Fatty Acids (FISH OIL PO) Take 1 tablet by mouth daily.    Yes [provider]  omeprazole (PRILOSEC) 20 MG capsule Take 20 mg by mouth daily.   Yes [provider]      Allergies  Allergen Reactions  .  Vibramycin [Doxycycline Hyclate] Other (See Comments)    All "mycins"; cause mouth sores  . Penicillins Rash    "all 'cillins" Has patient had a PCN reaction causing immediate rash, facial/tongue/throat swelling, SOB or lightheadedness with hypotension: Yes Has patient had a PCN reaction causing severe rash involving mucus membranes or skin necrosis: No Has patient had a PCN reaction that required hospitalization No Has patient had a PCN reaction occurring within the last 10 years: No If all of the above answers are "NO", then may proceed with Cephalosporin use.     ROS:  Out of a complete 14 system review of symptoms, the patient complains only of the following symptoms, and all other reviewed systems are  negative.  Blurred vision Constipation Joint pain, muscle cramps, aching muscles Allergies Headache, dizziness  Blood pressure (!) 142/92, pulse 72, height 5\' 11"  (1.803 m), weight 213 lb 8 oz (96.8 kg).  Physical Exam  General: The patient is alert and cooperative at the time of the examination.  Eyes: Pupils are equal, round, and reactive to light. Discs are flat bilaterally.  Neck: The neck is supple, no carotid bruits are noted.  Respiratory: The respiratory examination is clear.  Cardiovascular: The cardiovascular examination reveals a regular rate and rhythm, no obvious murmurs or rubs are noted.  Skin: Extremities are without significant edema.  Neurologic Exam  Mental status: The patient is alert and oriented x 3 at the time of the examination. The patient has apparent normal recent and remote memory, with an apparently normal attention span and concentration ability.  Cranial nerves: Facial symmetry is present. There is good sensation of the face to pinprick and soft touch bilaterally. The strength of the facial muscles and the muscles to head turning and shoulder shrug are normal bilaterally. Speech is well enunciated, no aphasia or dysarthria is noted. Extraocular movements are full. Visual fields are full. The tongue is midline, and the patient has symmetric elevation of the soft palate. No obvious hearing deficits are noted.  Motor: The motor testing reveals 5 over 5 strength of all 4 extremities. Good symmetric motor tone is noted throughout.  Sensory: Sensory testing is intact to pinprick, soft touch, vibration sensation, and position sense on all 4 extremities. No evidence of extinction is noted.  Coordination: Cerebellar testing reveals good finger-nose-finger and heel-to-shin bilaterally.  Gait and station: Gait is normal. Tandem gait is normal. Romberg is negative. No drift is seen.  Reflexes: Deep tendon reflexes are symmetric and normal bilaterally, with  exception of that there is a relative increase in the biceps reflex on the left as compared to the right and that the ankle jerk reflexes are depressed bilaterally. Toes are downgoing bilaterally.   Assessment/Plan:  1.  Left maxillary discomfort, neuropathic  The description of the pain in the left maxillary area is not fully consistent with trigeminal neuralgia.  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  He has gained good benefit with gabapentin which should be continued at this time.  The patient will follow-up through this office if needed.  Jill Alexanders MD 11/15/2017 10:01 AM  [facility] [address] [place],  [address]  Phone [phone] Fax [phone]

## 2017-12-08 DIAGNOSIS — M47816 Spondylosis without myelopathy or radiculopathy, lumbar region: Secondary | ICD-10-CM | POA: Diagnosis not present

## 2017-12-08 DIAGNOSIS — M545 Low back pain: Secondary | ICD-10-CM | POA: Diagnosis not present

## 2017-12-14 DIAGNOSIS — E7800 Pure hypercholesterolemia, unspecified: Secondary | ICD-10-CM | POA: Diagnosis not present

## 2017-12-14 DIAGNOSIS — I10 Essential (primary) hypertension: Secondary | ICD-10-CM | POA: Diagnosis not present

## 2017-12-14 DIAGNOSIS — M109 Gout, unspecified: Secondary | ICD-10-CM | POA: Diagnosis not present

## 2017-12-14 DIAGNOSIS — Z23 Encounter for immunization: Secondary | ICD-10-CM | POA: Diagnosis not present

## 2017-12-14 DIAGNOSIS — K219 Gastro-esophageal reflux disease without esophagitis: Secondary | ICD-10-CM | POA: Diagnosis not present

## 2018-01-03 ENCOUNTER — Other Ambulatory Visit: Payer: Self-pay | Admitting: Sports Medicine

## 2018-01-03 DIAGNOSIS — M5136 Other intervertebral disc degeneration, lumbar region: Secondary | ICD-10-CM

## 2018-01-03 DIAGNOSIS — M545 Low back pain, unspecified: Secondary | ICD-10-CM

## 2018-01-08 ENCOUNTER — Ambulatory Visit
Admission: RE | Admit: 2018-01-08 | Discharge: 2018-01-08 | Disposition: A | Discharge status: Home / Self Care | Payer: 59 | Source: Ambulatory Visit | Attending: Sports Medicine | Admitting: Sports Medicine

## 2018-01-08 DIAGNOSIS — M545 Low back pain, unspecified: Secondary | ICD-10-CM

## 2018-01-08 DIAGNOSIS — M48061 Spinal stenosis, lumbar region without neurogenic claudication: Secondary | ICD-10-CM | POA: Diagnosis not present

## 2018-01-08 DIAGNOSIS — M5136 Other intervertebral disc degeneration, lumbar region: Secondary | ICD-10-CM

## 2018-01-25 DIAGNOSIS — M48061 Spinal stenosis, lumbar region without neurogenic claudication: Secondary | ICD-10-CM | POA: Diagnosis not present

## 2018-01-25 DIAGNOSIS — M545 Low back pain: Secondary | ICD-10-CM | POA: Diagnosis not present

## 2018-01-25 DIAGNOSIS — M47816 Spondylosis without myelopathy or radiculopathy, lumbar region: Secondary | ICD-10-CM | POA: Diagnosis not present

## 2018-02-03 DIAGNOSIS — M47816 Spondylosis without myelopathy or radiculopathy, lumbar region: Secondary | ICD-10-CM | POA: Diagnosis not present

## 2018-02-03 DIAGNOSIS — M48061 Spinal stenosis, lumbar region without neurogenic claudication: Secondary | ICD-10-CM | POA: Diagnosis not present

## 2018-02-24 DIAGNOSIS — L821 Other seborrheic keratosis: Secondary | ICD-10-CM | POA: Diagnosis not present

## 2018-02-24 DIAGNOSIS — Z08 Encounter for follow-up examination after completed treatment for malignant neoplasm: Secondary | ICD-10-CM | POA: Diagnosis not present

## 2018-02-24 DIAGNOSIS — D045 Carcinoma in situ of skin of trunk: Secondary | ICD-10-CM | POA: Diagnosis not present

## 2018-02-24 DIAGNOSIS — L718 Other rosacea: Secondary | ICD-10-CM | POA: Diagnosis not present

## 2018-02-24 DIAGNOSIS — L570 Actinic keratosis: Secondary | ICD-10-CM | POA: Diagnosis not present

## 2018-03-01 DIAGNOSIS — M48061 Spinal stenosis, lumbar region without neurogenic claudication: Secondary | ICD-10-CM | POA: Diagnosis not present

## 2018-03-01 DIAGNOSIS — M5412 Radiculopathy, cervical region: Secondary | ICD-10-CM | POA: Diagnosis not present

## 2018-03-01 DIAGNOSIS — M545 Low back pain: Secondary | ICD-10-CM | POA: Diagnosis not present

## 2018-03-02 ENCOUNTER — Other Ambulatory Visit: Payer: Self-pay | Admitting: Physical Medicine and Rehabilitation

## 2018-03-02 DIAGNOSIS — M542 Cervicalgia: Secondary | ICD-10-CM

## 2018-03-05 ENCOUNTER — Ambulatory Visit
Admission: RE | Admit: 2018-03-05 | Discharge: 2018-03-05 | Disposition: A | Discharge status: Home / Self Care | Payer: 59 | Source: Ambulatory Visit | Attending: Physical Medicine and Rehabilitation | Admitting: Physical Medicine and Rehabilitation

## 2018-03-05 DIAGNOSIS — M542 Cervicalgia: Secondary | ICD-10-CM

## 2018-03-05 DIAGNOSIS — M4802 Spinal stenosis, cervical region: Secondary | ICD-10-CM | POA: Diagnosis not present

## 2018-03-06 DIAGNOSIS — M47812 Spondylosis without myelopathy or radiculopathy, cervical region: Secondary | ICD-10-CM | POA: Diagnosis not present

## 2018-03-06 DIAGNOSIS — M542 Cervicalgia: Secondary | ICD-10-CM | POA: Diagnosis not present

## 2018-03-06 DIAGNOSIS — M5412 Radiculopathy, cervical region: Secondary | ICD-10-CM | POA: Diagnosis not present

## 2018-03-08 DIAGNOSIS — D485 Neoplasm of uncertain behavior of skin: Secondary | ICD-10-CM | POA: Diagnosis not present

## 2018-03-08 DIAGNOSIS — C44311 Basal cell carcinoma of skin of nose: Secondary | ICD-10-CM | POA: Diagnosis not present

## 2018-03-17 DIAGNOSIS — M545 Low back pain: Secondary | ICD-10-CM | POA: Diagnosis not present

## 2018-03-17 DIAGNOSIS — M48061 Spinal stenosis, lumbar region without neurogenic claudication: Secondary | ICD-10-CM | POA: Diagnosis not present

## 2018-05-30 DIAGNOSIS — L719 Rosacea, unspecified: Secondary | ICD-10-CM | POA: Diagnosis not present

## 2018-05-30 DIAGNOSIS — H00011 Hordeolum externum right upper eyelid: Secondary | ICD-10-CM | POA: Diagnosis not present

## 2018-05-30 DIAGNOSIS — H01001 Unspecified blepharitis right upper eyelid: Secondary | ICD-10-CM | POA: Diagnosis not present

## 2018-06-06 DIAGNOSIS — H01001 Unspecified blepharitis right upper eyelid: Secondary | ICD-10-CM | POA: Diagnosis not present

## 2018-06-06 DIAGNOSIS — H00011 Hordeolum externum right upper eyelid: Secondary | ICD-10-CM | POA: Diagnosis not present

## 2018-06-06 DIAGNOSIS — L719 Rosacea, unspecified: Secondary | ICD-10-CM | POA: Diagnosis not present

## 2018-06-12 DIAGNOSIS — C44311 Basal cell carcinoma of skin of nose: Secondary | ICD-10-CM | POA: Diagnosis not present

## 2018-07-31 DIAGNOSIS — Z1211 Encounter for screening for malignant neoplasm of colon: Secondary | ICD-10-CM | POA: Diagnosis not present

## 2018-09-06 DIAGNOSIS — L821 Other seborrheic keratosis: Secondary | ICD-10-CM | POA: Diagnosis not present

## 2018-09-06 DIAGNOSIS — C44311 Basal cell carcinoma of skin of nose: Secondary | ICD-10-CM | POA: Diagnosis not present

## 2018-09-06 DIAGNOSIS — L570 Actinic keratosis: Secondary | ICD-10-CM | POA: Diagnosis not present

## 2018-10-16 DIAGNOSIS — L03221 Cellulitis of neck: Secondary | ICD-10-CM | POA: Diagnosis not present

## 2018-10-16 DIAGNOSIS — L259 Unspecified contact dermatitis, unspecified cause: Secondary | ICD-10-CM | POA: Diagnosis not present

## 2018-11-16 DIAGNOSIS — R21 Rash and other nonspecific skin eruption: Secondary | ICD-10-CM | POA: Diagnosis not present

## 2018-11-28 DIAGNOSIS — L718 Other rosacea: Secondary | ICD-10-CM | POA: Diagnosis not present

## 2018-11-28 DIAGNOSIS — L508 Other urticaria: Secondary | ICD-10-CM | POA: Diagnosis not present

## 2019-03-07 DIAGNOSIS — L821 Other seborrheic keratosis: Secondary | ICD-10-CM | POA: Diagnosis not present

## 2019-03-07 DIAGNOSIS — D485 Neoplasm of uncertain behavior of skin: Secondary | ICD-10-CM | POA: Diagnosis not present

## 2019-03-07 DIAGNOSIS — Z08 Encounter for follow-up examination after completed treatment for malignant neoplasm: Secondary | ICD-10-CM | POA: Diagnosis not present

## 2019-03-07 DIAGNOSIS — L570 Actinic keratosis: Secondary | ICD-10-CM | POA: Diagnosis not present

## 2019-03-07 DIAGNOSIS — Z85828 Personal history of other malignant neoplasm of skin: Secondary | ICD-10-CM | POA: Diagnosis not present

## 2019-03-07 DIAGNOSIS — D04121 Carcinoma in situ of skin of left upper eyelid, including canthus: Secondary | ICD-10-CM | POA: Diagnosis not present

## 2019-03-07 DIAGNOSIS — L905 Scar conditions and fibrosis of skin: Secondary | ICD-10-CM | POA: Diagnosis not present

## 2019-06-08 DIAGNOSIS — L719 Rosacea, unspecified: Secondary | ICD-10-CM | POA: Diagnosis not present

## 2019-06-08 DIAGNOSIS — H01001 Unspecified blepharitis right upper eyelid: Secondary | ICD-10-CM | POA: Diagnosis not present

## 2019-06-08 DIAGNOSIS — H00011 Hordeolum externum right upper eyelid: Secondary | ICD-10-CM | POA: Diagnosis not present

## 2019-06-08 DIAGNOSIS — H01002 Unspecified blepharitis right lower eyelid: Secondary | ICD-10-CM | POA: Diagnosis not present

## 2019-07-27 DIAGNOSIS — H02054 Trichiasis without entropian left upper eyelid: Secondary | ICD-10-CM | POA: Diagnosis not present

## 2019-07-27 DIAGNOSIS — L719 Rosacea, unspecified: Secondary | ICD-10-CM | POA: Diagnosis not present

## 2019-09-12 DIAGNOSIS — L578 Other skin changes due to chronic exposure to nonionizing radiation: Secondary | ICD-10-CM | POA: Diagnosis not present

## 2019-09-12 DIAGNOSIS — L738 Other specified follicular disorders: Secondary | ICD-10-CM | POA: Diagnosis not present

## 2019-09-12 DIAGNOSIS — D225 Melanocytic nevi of trunk: Secondary | ICD-10-CM | POA: Diagnosis not present

## 2019-09-12 DIAGNOSIS — L570 Actinic keratosis: Secondary | ICD-10-CM | POA: Diagnosis not present

## 2019-10-05 DIAGNOSIS — Z125 Encounter for screening for malignant neoplasm of prostate: Secondary | ICD-10-CM | POA: Diagnosis not present

## 2019-10-05 DIAGNOSIS — E7800 Pure hypercholesterolemia, unspecified: Secondary | ICD-10-CM | POA: Diagnosis not present

## 2019-10-05 DIAGNOSIS — Z0000 Encounter for general adult medical examination without abnormal findings: Secondary | ICD-10-CM | POA: Diagnosis not present

## 2019-10-05 DIAGNOSIS — M109 Gout, unspecified: Secondary | ICD-10-CM | POA: Diagnosis not present

## 2019-10-05 DIAGNOSIS — R079 Chest pain, unspecified: Secondary | ICD-10-CM | POA: Diagnosis not present

## 2019-10-05 DIAGNOSIS — I10 Essential (primary) hypertension: Secondary | ICD-10-CM | POA: Diagnosis not present

## 2020-02-21 ENCOUNTER — Other Ambulatory Visit: Payer: Self-pay | Admitting: Pain Medicine

## 2020-02-21 DIAGNOSIS — G509 Disorder of trigeminal nerve, unspecified: Secondary | ICD-10-CM

## 2020-03-21 DIAGNOSIS — G8929 Other chronic pain: Secondary | ICD-10-CM | POA: Insufficient documentation

## 2020-03-21 DIAGNOSIS — M48061 Spinal stenosis, lumbar region without neurogenic claudication: Secondary | ICD-10-CM | POA: Insufficient documentation

## 2020-03-21 DIAGNOSIS — M545 Low back pain, unspecified: Secondary | ICD-10-CM | POA: Insufficient documentation

## 2020-03-21 DIAGNOSIS — M47816 Spondylosis without myelopathy or radiculopathy, lumbar region: Secondary | ICD-10-CM | POA: Insufficient documentation

## 2020-03-22 ENCOUNTER — Other Ambulatory Visit: Payer: Self-pay

## 2020-03-22 ENCOUNTER — Ambulatory Visit
Admission: RE | Admit: 2020-03-22 | Discharge: 2020-03-22 | Disposition: A | Discharge status: Home / Self Care | Payer: 59 | Source: Ambulatory Visit | Attending: Pain Medicine | Admitting: Pain Medicine

## 2020-03-22 DIAGNOSIS — G509 Disorder of trigeminal nerve, unspecified: Secondary | ICD-10-CM

## 2020-03-22 MED ORDER — GADOBENATE DIMEGLUMINE 529 MG/ML IV SOLN
15.0000 mL | Freq: Once | INTRAVENOUS | Status: AC | PRN
  Administered 2020-03-22: 15 mL via INTRAVENOUS

## 2020-08-13 DIAGNOSIS — M47816 Spondylosis without myelopathy or radiculopathy, lumbar region: Secondary | ICD-10-CM | POA: Diagnosis not present

## 2020-08-13 DIAGNOSIS — G8929 Other chronic pain: Secondary | ICD-10-CM | POA: Diagnosis not present

## 2020-08-13 DIAGNOSIS — M5441 Lumbago with sciatica, right side: Secondary | ICD-10-CM | POA: Diagnosis not present

## 2020-08-13 DIAGNOSIS — M7918 Myalgia, other site: Secondary | ICD-10-CM | POA: Diagnosis not present

## 2020-08-22 DIAGNOSIS — M533 Sacrococcygeal disorders, not elsewhere classified: Secondary | ICD-10-CM | POA: Diagnosis not present

## 2020-08-22 DIAGNOSIS — G8929 Other chronic pain: Secondary | ICD-10-CM | POA: Diagnosis not present

## 2020-08-22 DIAGNOSIS — M7918 Myalgia, other site: Secondary | ICD-10-CM | POA: Diagnosis not present

## 2020-09-23 DIAGNOSIS — G8929 Other chronic pain: Secondary | ICD-10-CM | POA: Diagnosis not present

## 2020-09-23 DIAGNOSIS — M62838 Other muscle spasm: Secondary | ICD-10-CM | POA: Diagnosis not present

## 2020-09-23 DIAGNOSIS — M5441 Lumbago with sciatica, right side: Secondary | ICD-10-CM | POA: Diagnosis not present

## 2020-09-23 DIAGNOSIS — M47816 Spondylosis without myelopathy or radiculopathy, lumbar region: Secondary | ICD-10-CM | POA: Diagnosis not present

## 2020-09-23 DIAGNOSIS — M5416 Radiculopathy, lumbar region: Secondary | ICD-10-CM | POA: Diagnosis not present

## 2020-09-29 ENCOUNTER — Other Ambulatory Visit: Payer: Self-pay | Admitting: Physician Assistant

## 2020-09-29 DIAGNOSIS — M5441 Lumbago with sciatica, right side: Secondary | ICD-10-CM

## 2020-09-29 DIAGNOSIS — M5416 Radiculopathy, lumbar region: Secondary | ICD-10-CM

## 2020-09-29 DIAGNOSIS — Z85828 Personal history of other malignant neoplasm of skin: Secondary | ICD-10-CM | POA: Diagnosis not present

## 2020-09-29 DIAGNOSIS — L821 Other seborrheic keratosis: Secondary | ICD-10-CM | POA: Diagnosis not present

## 2020-09-29 DIAGNOSIS — L570 Actinic keratosis: Secondary | ICD-10-CM | POA: Diagnosis not present

## 2020-09-29 DIAGNOSIS — G8929 Other chronic pain: Secondary | ICD-10-CM

## 2020-09-29 DIAGNOSIS — L578 Other skin changes due to chronic exposure to nonionizing radiation: Secondary | ICD-10-CM | POA: Diagnosis not present

## 2020-10-16 DIAGNOSIS — Z0000 Encounter for general adult medical examination without abnormal findings: Secondary | ICD-10-CM | POA: Diagnosis not present

## 2020-10-16 DIAGNOSIS — M62838 Other muscle spasm: Secondary | ICD-10-CM | POA: Diagnosis not present

## 2020-10-16 DIAGNOSIS — M109 Gout, unspecified: Secondary | ICD-10-CM | POA: Diagnosis not present

## 2020-10-16 DIAGNOSIS — K219 Gastro-esophageal reflux disease without esophagitis: Secondary | ICD-10-CM | POA: Diagnosis not present

## 2020-10-16 DIAGNOSIS — I10 Essential (primary) hypertension: Secondary | ICD-10-CM | POA: Diagnosis not present

## 2020-10-16 DIAGNOSIS — R945 Abnormal results of liver function studies: Secondary | ICD-10-CM | POA: Diagnosis not present

## 2020-10-16 DIAGNOSIS — R7989 Other specified abnormal findings of blood chemistry: Secondary | ICD-10-CM | POA: Diagnosis not present

## 2020-10-16 DIAGNOSIS — L570 Actinic keratosis: Secondary | ICD-10-CM | POA: Diagnosis not present

## 2020-10-16 DIAGNOSIS — E7800 Pure hypercholesterolemia, unspecified: Secondary | ICD-10-CM | POA: Diagnosis not present

## 2020-10-16 DIAGNOSIS — D485 Neoplasm of uncertain behavior of skin: Secondary | ICD-10-CM | POA: Diagnosis not present

## 2020-10-17 ENCOUNTER — Ambulatory Visit
Admission: RE | Admit: 2020-10-17 | Discharge: 2020-10-17 | Disposition: A | Discharge status: Home / Self Care | Payer: Medicare Other | Source: Ambulatory Visit | Attending: Physician Assistant | Admitting: Physician Assistant

## 2020-10-17 DIAGNOSIS — M5441 Lumbago with sciatica, right side: Secondary | ICD-10-CM

## 2020-10-17 DIAGNOSIS — M5416 Radiculopathy, lumbar region: Secondary | ICD-10-CM

## 2020-10-17 DIAGNOSIS — G8929 Other chronic pain: Secondary | ICD-10-CM

## 2020-10-17 DIAGNOSIS — M5127 Other intervertebral disc displacement, lumbosacral region: Secondary | ICD-10-CM | POA: Diagnosis not present

## 2020-10-21 DIAGNOSIS — G8929 Other chronic pain: Secondary | ICD-10-CM | POA: Diagnosis not present

## 2020-10-21 DIAGNOSIS — M62838 Other muscle spasm: Secondary | ICD-10-CM | POA: Diagnosis not present

## 2020-10-21 DIAGNOSIS — M47816 Spondylosis without myelopathy or radiculopathy, lumbar region: Secondary | ICD-10-CM | POA: Diagnosis not present

## 2020-10-21 DIAGNOSIS — M5416 Radiculopathy, lumbar region: Secondary | ICD-10-CM | POA: Diagnosis not present

## 2020-11-19 DIAGNOSIS — M5416 Radiculopathy, lumbar region: Secondary | ICD-10-CM | POA: Diagnosis not present

## 2020-12-29 DIAGNOSIS — M62838 Other muscle spasm: Secondary | ICD-10-CM | POA: Diagnosis not present

## 2020-12-29 DIAGNOSIS — G8929 Other chronic pain: Secondary | ICD-10-CM | POA: Diagnosis not present

## 2020-12-29 DIAGNOSIS — M5416 Radiculopathy, lumbar region: Secondary | ICD-10-CM | POA: Diagnosis not present

## 2020-12-29 DIAGNOSIS — M47816 Spondylosis without myelopathy or radiculopathy, lumbar region: Secondary | ICD-10-CM | POA: Diagnosis not present

## 2021-01-13 DIAGNOSIS — H43811 Vitreous degeneration, right eye: Secondary | ICD-10-CM | POA: Diagnosis not present

## 2021-01-13 DIAGNOSIS — H25813 Combined forms of age-related cataract, bilateral: Secondary | ICD-10-CM | POA: Diagnosis not present

## 2021-01-14 DIAGNOSIS — H01009 Unspecified blepharitis unspecified eye, unspecified eyelid: Secondary | ICD-10-CM | POA: Diagnosis not present

## 2021-01-14 DIAGNOSIS — H43391 Other vitreous opacities, right eye: Secondary | ICD-10-CM | POA: Diagnosis not present

## 2021-01-14 DIAGNOSIS — H25813 Combined forms of age-related cataract, bilateral: Secondary | ICD-10-CM | POA: Diagnosis not present

## 2021-01-14 DIAGNOSIS — H53141 Visual discomfort, right eye: Secondary | ICD-10-CM | POA: Diagnosis not present

## 2021-01-14 DIAGNOSIS — H43811 Vitreous degeneration, right eye: Secondary | ICD-10-CM | POA: Diagnosis not present

## 2021-01-15 DIAGNOSIS — M461 Sacroiliitis, not elsewhere classified: Secondary | ICD-10-CM | POA: Diagnosis not present

## 2021-01-15 DIAGNOSIS — R030 Elevated blood-pressure reading, without diagnosis of hypertension: Secondary | ICD-10-CM | POA: Diagnosis not present

## 2021-01-28 DIAGNOSIS — R030 Elevated blood-pressure reading, without diagnosis of hypertension: Secondary | ICD-10-CM | POA: Diagnosis not present

## 2021-01-28 DIAGNOSIS — M47816 Spondylosis without myelopathy or radiculopathy, lumbar region: Secondary | ICD-10-CM | POA: Diagnosis not present

## 2021-02-04 DIAGNOSIS — H21341 Primary cyst of pars plana, right eye: Secondary | ICD-10-CM | POA: Diagnosis not present

## 2021-02-04 DIAGNOSIS — H43811 Vitreous degeneration, right eye: Secondary | ICD-10-CM | POA: Diagnosis not present

## 2021-02-04 DIAGNOSIS — H43391 Other vitreous opacities, right eye: Secondary | ICD-10-CM | POA: Diagnosis not present

## 2021-02-26 DIAGNOSIS — M47816 Spondylosis without myelopathy or radiculopathy, lumbar region: Secondary | ICD-10-CM | POA: Diagnosis not present

## 2021-02-26 DIAGNOSIS — M7138 Other bursal cyst, other site: Secondary | ICD-10-CM | POA: Diagnosis not present

## 2021-03-25 DIAGNOSIS — G2581 Restless legs syndrome: Secondary | ICD-10-CM | POA: Diagnosis not present

## 2021-03-25 DIAGNOSIS — M47816 Spondylosis without myelopathy or radiculopathy, lumbar region: Secondary | ICD-10-CM | POA: Diagnosis not present

## 2021-03-30 DIAGNOSIS — L570 Actinic keratosis: Secondary | ICD-10-CM | POA: Diagnosis not present

## 2021-03-30 DIAGNOSIS — L821 Other seborrheic keratosis: Secondary | ICD-10-CM | POA: Diagnosis not present

## 2021-03-30 DIAGNOSIS — L820 Inflamed seborrheic keratosis: Secondary | ICD-10-CM | POA: Diagnosis not present

## 2021-03-30 DIAGNOSIS — Z85828 Personal history of other malignant neoplasm of skin: Secondary | ICD-10-CM | POA: Diagnosis not present

## 2021-03-30 DIAGNOSIS — D1801 Hemangioma of skin and subcutaneous tissue: Secondary | ICD-10-CM | POA: Diagnosis not present

## 2021-03-30 DIAGNOSIS — Z86008 Personal history of in-situ neoplasm of other site: Secondary | ICD-10-CM | POA: Diagnosis not present

## 2021-03-30 DIAGNOSIS — L910 Hypertrophic scar: Secondary | ICD-10-CM | POA: Diagnosis not present

## 2021-03-30 DIAGNOSIS — L578 Other skin changes due to chronic exposure to nonionizing radiation: Secondary | ICD-10-CM | POA: Diagnosis not present

## 2021-04-01 DIAGNOSIS — H43311 Vitreous membranes and strands, right eye: Secondary | ICD-10-CM | POA: Diagnosis not present

## 2021-04-01 DIAGNOSIS — H25813 Combined forms of age-related cataract, bilateral: Secondary | ICD-10-CM | POA: Diagnosis not present

## 2021-04-01 DIAGNOSIS — H33321 Round hole, right eye: Secondary | ICD-10-CM | POA: Diagnosis not present

## 2021-04-01 DIAGNOSIS — H43811 Vitreous degeneration, right eye: Secondary | ICD-10-CM | POA: Diagnosis not present

## 2021-04-06 DIAGNOSIS — M47816 Spondylosis without myelopathy or radiculopathy, lumbar region: Secondary | ICD-10-CM | POA: Diagnosis not present

## 2021-04-09 DIAGNOSIS — H33321 Round hole, right eye: Secondary | ICD-10-CM | POA: Diagnosis not present

## 2021-05-13 ENCOUNTER — Emergency Department (HOSPITAL_BASED_OUTPATIENT_CLINIC_OR_DEPARTMENT_OTHER): Payer: Medicare Other

## 2021-05-13 ENCOUNTER — Encounter (HOSPITAL_COMMUNITY): Payer: Self-pay | Admitting: Oncology

## 2021-05-13 ENCOUNTER — Emergency Department (HOSPITAL_BASED_OUTPATIENT_CLINIC_OR_DEPARTMENT_OTHER)
Admission: EM | Admit: 2021-05-13 | Discharge: 2021-05-13 | Disposition: A | Discharge status: Home / Self Care | Payer: Medicare Other | Source: Home / Self Care | Attending: Emergency Medicine | Admitting: Emergency Medicine

## 2021-05-13 ENCOUNTER — Emergency Department (HOSPITAL_COMMUNITY)
Admission: EM | Admit: 2021-05-13 | Discharge: 2021-05-13 | Disposition: A | Discharge status: Home / Self Care | Payer: Medicare Other | Attending: Emergency Medicine | Admitting: Emergency Medicine

## 2021-05-13 ENCOUNTER — Encounter (HOSPITAL_BASED_OUTPATIENT_CLINIC_OR_DEPARTMENT_OTHER): Payer: Self-pay

## 2021-05-13 ENCOUNTER — Other Ambulatory Visit: Payer: Self-pay

## 2021-05-13 DIAGNOSIS — Z79899 Other long term (current) drug therapy: Secondary | ICD-10-CM | POA: Insufficient documentation

## 2021-05-13 DIAGNOSIS — I10 Essential (primary) hypertension: Secondary | ICD-10-CM | POA: Insufficient documentation

## 2021-05-13 DIAGNOSIS — K859 Acute pancreatitis without necrosis or infection, unspecified: Secondary | ICD-10-CM | POA: Insufficient documentation

## 2021-05-13 DIAGNOSIS — R0789 Other chest pain: Secondary | ICD-10-CM | POA: Insufficient documentation

## 2021-05-13 DIAGNOSIS — M546 Pain in thoracic spine: Secondary | ICD-10-CM | POA: Insufficient documentation

## 2021-05-13 DIAGNOSIS — R911 Solitary pulmonary nodule: Secondary | ICD-10-CM | POA: Diagnosis not present

## 2021-05-13 DIAGNOSIS — Z85828 Personal history of other malignant neoplasm of skin: Secondary | ICD-10-CM | POA: Insufficient documentation

## 2021-05-13 DIAGNOSIS — K219 Gastro-esophageal reflux disease without esophagitis: Secondary | ICD-10-CM | POA: Insufficient documentation

## 2021-05-13 DIAGNOSIS — Z7722 Contact with and (suspected) exposure to environmental tobacco smoke (acute) (chronic): Secondary | ICD-10-CM | POA: Insufficient documentation

## 2021-05-13 DIAGNOSIS — R1013 Epigastric pain: Secondary | ICD-10-CM | POA: Diagnosis present

## 2021-05-13 DIAGNOSIS — K8689 Other specified diseases of pancreas: Secondary | ICD-10-CM | POA: Diagnosis not present

## 2021-05-13 DIAGNOSIS — K760 Fatty (change of) liver, not elsewhere classified: Secondary | ICD-10-CM | POA: Diagnosis not present

## 2021-05-13 HISTORY — DX: Acute pancreatitis without necrosis or infection, unspecified: K85.90

## 2021-05-13 LAB — PROTIME-INR
INR: 1.1 (ref 0.8–1.2)
Prothrombin Time: 14.6 seconds (ref 11.4–15.2)

## 2021-05-13 LAB — URINALYSIS, ROUTINE W REFLEX MICROSCOPIC
Glucose, UA: NEGATIVE mg/dL
Hgb urine dipstick: NEGATIVE
Ketones, ur: 80 mg/dL — AB
Leukocytes,Ua: NEGATIVE
Nitrite: NEGATIVE
Protein, ur: NEGATIVE mg/dL
Specific Gravity, Urine: 1.03 (ref 1.005–1.030)
pH: 5.5 (ref 5.0–8.0)

## 2021-05-13 LAB — CBC
HCT: 48 % (ref 39.0–52.0)
HCT: 40.4 % (ref 39.0–52.0)
Hemoglobin: 16.4 g/dL (ref 13.0–17.0)
Hemoglobin: 13.9 g/dL (ref 13.0–17.0)
MCH: 31.6 pg (ref 26.0–34.0)
MCH: 31.7 pg (ref 26.0–34.0)
MCHC: 34.2 g/dL (ref 30.0–36.0)
MCHC: 34.4 g/dL (ref 30.0–36.0)
MCV: 92.5 fL (ref 80.0–100.0)
MCV: 92.2 fL (ref 80.0–100.0)
Platelets: 229 10*3/uL (ref 150–400)
Platelets: 192 10*3/uL (ref 150–400)
RBC: 5.19 MIL/uL (ref 4.22–5.81)
RBC: 4.38 MIL/uL (ref 4.22–5.81)
RDW: 12.8 % (ref 11.5–15.5)
RDW: 12.8 % (ref 11.5–15.5)
WBC: 12.6 10*3/uL — ABNORMAL HIGH (ref 4.0–10.5)
WBC: 9.1 10*3/uL (ref 4.0–10.5)
nRBC: 0 % (ref 0.0–0.2)
nRBC: 0 % (ref 0.0–0.2)

## 2021-05-13 LAB — COMPREHENSIVE METABOLIC PANEL
ALT: 25 U/L (ref 0–44)
AST: 23 U/L (ref 15–41)
Albumin: 4.3 g/dL (ref 3.5–5.0)
Alkaline Phosphatase: 79 U/L (ref 38–126)
Anion gap: 12 (ref 5–15)
BUN: 18 mg/dL (ref 8–23)
CO2: 27 mmol/L (ref 22–32)
Calcium: 9.8 mg/dL (ref 8.9–10.3)
Chloride: 104 mmol/L (ref 98–111)
Creatinine, Ser: 0.96 mg/dL (ref 0.61–1.24)
GFR, Estimated: >60 mL/min (ref >60)
Glucose, Bld: 112 mg/dL — ABNORMAL HIGH (ref 70–99)
Potassium: 5.3 mmol/L — ABNORMAL HIGH (ref 3.5–5.1)
Sodium: 143 mmol/L (ref 135–145)
Total Bilirubin: 1.6 mg/dL — ABNORMAL HIGH (ref 0.3–1.2)
Total Protein: 7.4 g/dL (ref 6.5–8.1)

## 2021-05-13 LAB — TROPONIN I (HIGH SENSITIVITY)
Troponin I (High Sensitivity): 3 ng/L (ref <18)
Troponin I (High Sensitivity): 3 ng/L (ref <18)

## 2021-05-13 LAB — LIPASE, BLOOD
Lipase: 39 U/L (ref 11–51)
Lipase: 80 U/L — ABNORMAL HIGH (ref 11–51)

## 2021-05-13 MED ORDER — TRAMADOL HCL 50 MG PO TABS: ORAL_TABLET | ORAL | 0 refills | Status: DC

## 2021-05-13 MED ORDER — SODIUM CHLORIDE 0.9 % IV BOLUS
1000.0000 mL | Freq: Once | INTRAVENOUS | Status: AC
  Administered 2021-05-13: 1000 mL via INTRAVENOUS

## 2021-05-13 MED ORDER — OXYCODONE-ACETAMINOPHEN 5-325 MG PO TABS: 1.0000 | ORAL_TABLET | Freq: Four times a day (QID) | ORAL | 0 refills | Status: DC | PRN

## 2021-05-13 MED ORDER — ONDANSETRON HCL 4 MG/2ML IJ SOLN
4.0000 mg | Freq: Once | INTRAMUSCULAR | Status: AC
  Administered 2021-05-13: 4 mg via INTRAVENOUS
  Filled 2021-05-13: qty 2

## 2021-05-13 MED ORDER — OXYCODONE-ACETAMINOPHEN 5-325 MG PO TABS
2.0000 | ORAL_TABLET | Freq: Once | ORAL | Status: DC
Start: 2021-05-13 — End: 2021-05-13
  Filled 2021-05-13: qty 2

## 2021-05-13 MED ORDER — PANTOPRAZOLE SODIUM 40 MG IV SOLR
40.0000 mg | Freq: Once | INTRAVENOUS | Status: AC
  Administered 2021-05-13: 40 mg via INTRAVENOUS
  Filled 2021-05-13: qty 40

## 2021-05-13 MED ORDER — SODIUM CHLORIDE 0.9 % IV BOLUS (SEPSIS)
1000.0000 mL | Freq: Once | INTRAVENOUS | Status: AC
  Administered 2021-05-13: 1000 mL via INTRAVENOUS

## 2021-05-13 MED ORDER — IOHEXOL 350 MG/ML SOLN
100.0000 mL | Freq: Once | INTRAVENOUS | Status: AC | PRN
  Administered 2021-05-13: 100 mL via INTRAVENOUS

## 2021-05-13 MED ORDER — SODIUM CHLORIDE 0.9 % IV SOLN: Freq: Once | INTRAVENOUS | Status: AC

## 2021-05-13 MED ORDER — TRAMADOL HCL 50 MG PO TABS
100.0000 mg | ORAL_TABLET | Freq: Once | ORAL | Status: AC
Start: 2021-05-13 — End: 2021-05-13
  Administered 2021-05-13: 100 mg via ORAL
  Filled 2021-05-13: qty 2

## 2021-05-13 MED ORDER — ONDANSETRON 4 MG PO TBDP: 4.0000 mg | ORAL_TABLET | ORAL | 0 refills | Status: DC | PRN

## 2021-05-13 MED ORDER — HYDROMORPHONE HCL 1 MG/ML IJ SOLN
1.0000 mg | Freq: Once | INTRAMUSCULAR | Status: AC
  Administered 2021-05-13: 1 mg via INTRAVENOUS
  Filled 2021-05-13: qty 1

## 2021-05-13 NOTE — ED Triage Notes (Signed)
Pt c/o abdominal and mid back pain x 2 days. Hx pancreatitis. States hasnt eaten since yesterday. Pt seen at [facility] earlier today, left AMA, had blood work drawn.

## 2021-05-13 NOTE — Discharge Instructions (Signed)
1.  Call Dr. Osborn Coho office to schedule a follow-up within the next 2 to 3 days 2.  You must have a no fat diet.  For the next 12 hours clear liquids only.  Continue taking your omeprazole and regular medications.  When you resume a diet, very bland foods that did not have fat that includes dairy fat as well as animal fat. 3.  Return to the emergency department if you have worsening pain, develop vomiting or other concerning symptoms.

## 2021-05-13 NOTE — ED Notes (Signed)
Medicated as per ED MD orders, safety measures in place, sr x 2 up, family at bedside, call bell within reach, client remains NPO and also instructed not to get up without staff in room secondary to rec opioids.

## 2021-05-13 NOTE — ED Triage Notes (Signed)
Pt reports upper abdominal pain x 2 days.  States it feels like pancreatitis again.  Pt has been NPO today attempting to control pain.  States his urine is getting dark.

## 2021-05-13 NOTE — ED Provider Notes (Signed)
[facility] DEPARTMENT Provider Note   CSN: [id number] Arrival date & time: 05/13/21  1726     History Chief Complaint  Patient presents with   Abdominal Pain    Corey Hawkins is a 65 y.o. male.  HPI Patient reports he started with pain in his epigastrium and thoracic back 2 days ago.  He reports at onset it was moderate but has gotten severe over the past day.  Pain is very sharp and radiates across his back behind his shoulder blades.  Patient reports that he stopped eating because of the pain.  He has a history of pancreatitis and assumed he was getting pancreatitis.  He reports he does feel it pain worsens with eating.  He denies vomiting any bloody looking material.  No lower abdominal pain associated.  Pain does radiate somewhat beneath the left breast.  This pain is sharp.  He perceives more of the pain in his central back.  No syncope.  Patient has history of upper and lower endoscopy within the past 18 months without abnormal findings.  Patient reports he takes daily omeprazole.  He reports he does still have his gallbladder.  Past Medical History:  Diagnosis Date   GERD (gastroesophageal reflux disease)    Hypertension    Pancreatitis    Skin cancer     Patient Active Problem List   Diagnosis Date Noted   Back spasm 08/16/2015   Pancreatitis 08/14/2015   Hepatic lesion 08/14/2015   Essential hypertension 08/14/2015   GERD (gastroesophageal reflux disease) 08/14/2015   Hyponatremia 08/14/2015   Hyperglycemia 08/14/2015   Gout 08/14/2015   Pancreatitis, acute     Past Surgical History:  Procedure Laterality Date   KNEE SURGERY     NECK SURGERY         Family History  Problem Relation Age of Onset   Gallbladder disease Mother    Gallbladder disease Father    Gallbladder disease Brother     Social History   Tobacco Use   Smoking status: Never    Passive exposure: Yes   Smokeless tobacco: Never  Substance Use Topics   Alcohol use:  Yes    Comment: ocassional   Drug use: No    Home Medications Prior to Admission medications   Medication Sig Start Date End Date Taking? Authorizing Provider  ondansetron (ZOFRAN ODT) 4 MG disintegrating tablet Take 1 tablet (4 mg total) by mouth every 4 (four) hours as needed for nausea or vomiting. 05/13/21  Yes Charlesetta Shanks, MD  oxyCODONE-acetaminophen (PERCOCET) 5-325 MG tablet Take 1-2 tablets by mouth every 6 (six) hours as needed. 05/13/21  Yes Charlesetta Shanks, MD  allopurinol (ZYLOPRIM) 300 MG tablet Take 300 mg by mouth daily. 08/11/15   [provider]  co-enzyme Q-10 30 MG capsule Take 30 mg by mouth daily.     [provider]  diazepam (VALIUM) 5 MG tablet Take 1 tablet (5 mg total) by mouth every 8 (eight) hours as needed for muscle spasms. 08/17/15   Delfina Redwood, MD  gabapentin (NEURONTIN) 300 MG capsule Take 300 mg by mouth 4 (four) times daily.    [provider]  HYDROmorphone (DILAUDID) 2 MG tablet Take 1 tablet (2 mg total) by mouth every 4 (four) hours as needed for severe pain. 08/17/15   Delfina Redwood, MD  lisinopril (PRINIVIL,ZESTRIL) 2.5 MG tablet Take 2.5 mg by mouth daily.    [provider]  Omega-3 Fatty Acids (FISH OIL PO)  Take 1 tablet by mouth daily.     [provider]  omeprazole (PRILOSEC) 20 MG capsule Take 20 mg by mouth daily.    [provider]    Allergies    Vibramycin [doxycycline hyclate] and Penicillins  Review of Systems   Review of Systems 10 systems reviewed and negative except as per HPI Physical Exam Updated Vital Signs BP (!) 149/90   Pulse 70   Temp 98.3 F (36.8 C) (Oral)   Resp 13   Ht 5\' 11"  (1.803 m)   Wt 90.7 kg   SpO2 92%   BMI 27.89 kg/m   Physical Exam Constitutional:      Comments: Alert nontoxic no respiratory distress  HENT:     Mouth/Throat:     Pharynx: Oropharynx is clear.  Eyes:     Extraocular Movements: Extraocular movements intact.   Cardiovascular:     Rate and Rhythm: Normal rate and regular rhythm.  Pulmonary:     Effort: Pulmonary effort is normal.     Breath sounds: Normal breath sounds.  Abdominal:     General: There is no distension.     Palpations: Abdomen is soft.     Tenderness: There is abdominal tenderness in the suprapubic area. There is no guarding.  Musculoskeletal:        General: No swelling or tenderness. Normal range of motion.     Right lower leg: No edema.     Left lower leg: No edema.  Skin:    General: Skin is warm and dry.  Neurological:     General: No focal deficit present.     Mental Status: He is oriented to person, place, and time.     Motor: No weakness.     Coordination: Coordination normal.  Psychiatric:        Mood and Affect: Mood normal.    ED Results / Procedures / Treatments   Labs (all labs ordered are listed, but only abnormal results are displayed) Labs Reviewed  URINALYSIS, ROUTINE W REFLEX MICROSCOPIC - Abnormal; Notable for the following components:      Result Value   APPearance CLOUDY (*)    Bilirubin Urine MODERATE (*)    Ketones, ur 80 (*)    All other components within normal limits  LIPASE, BLOOD - Abnormal; Notable for the following components:   Lipase 80 (*)    All other components within normal limits  CBC  PROTIME-INR  TROPONIN I (HIGH SENSITIVITY)  TROPONIN I (HIGH SENSITIVITY)    EKG EKG Interpretation  Date/Time:  Wednesday May 13 2021 20:29:01 EDT Ventricular Rate:  73 PR Interval:  159 QRS Duration: 93 QT Interval:  398 QTC Calculation: 439 R Axis:   0 Text Interpretation: Sinus rhythm Abnormal R-wave progression, early transition no change from previous Confirmed by Charlesetta Shanks [phone]) on 05/13/2021 8:51:46 PM  Radiology CT Angio Chest/Abd/Pel for Dissection W and/or W/WO  Result Date: 05/13/2021 CLINICAL DATA:  Upper abdominal pain for 2 days, history of pancreatitis EXAM: CT ANGIOGRAPHY CHEST, ABDOMEN AND PELVIS  TECHNIQUE: Non-contrast CT of the chest was initially obtained. Multidetector CT imaging through the chest, abdomen and pelvis was performed using the standard protocol during bolus administration of intravenous contrast. Multiplanar reconstructed images and MIPs were obtained and reviewed to evaluate the vascular anatomy. CONTRAST:  121mL OMNIPAQUE IOHEXOL 350 MG/ML SOLN COMPARISON:  CT from 08/14/2015 FINDINGS: CTA CHEST FINDINGS Cardiovascular: Initial precontrast images demonstrate no hyperdense crescent to suggest acute aortic injury. Thoracic aorta  and its branches show no aneurysmal dilatation or dissection. No cardiac enlargement is noted. No significant atherosclerotic calcifications are seen. No coronary calcifications are noted. The pulmonary artery as visualized appears within normal limits. Mediastinum/Nodes: Thoracic inlet is within normal limits. No sizable hilar or mediastinal adenopathy is noted. The esophagus as visualized is within normal limits. Lungs/Pleura: The lungs are well aerated bilaterally. No focal infiltrate or sizable effusion is seen. 5 mm nodule is noted in the right lower lobe along the margin of the diaphragm best seen on image number 39 series 6. This is stable in appearance from the prior exam from 2017. No other nodules are seen. Musculoskeletal: Mild degenerative changes of the thoracic spine are noted. No findings to suggest rib fracture are seen. No compression deformities are noted. Review of the MIP images confirms the above findings. CTA ABDOMEN AND PELVIS FINDINGS VASCULAR Aorta: Atherosclerotic calcifications are noted without aneurysmal dilatation or dissection. Celiac: Patent without evidence of aneurysm, dissection, vasculitis or significant stenosis. SMA: Patent without evidence of aneurysm, dissection, vasculitis or significant stenosis. Renals: 3 renal arteries are noted on the right. Single renal artery on the left. No focal stenosis is seen. IMA: Patent without  evidence of aneurysm, dissection, vasculitis or significant stenosis. Inflow: Iliacs demonstrate atherosclerotic calcification without focal stenosis. Veins: No specific venous abnormality is noted. Review of the MIP images confirms the above findings. NON-VASCULAR Hepatobiliary: Liver is diffusely fatty infiltrated. There is a rounded enhancing lesion identified in the medial aspect of the right lobe of the liver better visualized than on prior exam consistent with a hemangioma. No biliary ductal dilatation is seen. Gallbladder is within normal limits. Pancreas: Pancreas is well visualized. Some very minimal peripancreatic inflammatory changes are noted in the region of the head likely representing mild focal pancreatitis. No focal pancreatic mass is seen. Spleen: Normal in size without focal abnormality. Adrenals/Urinary Tract: Adrenal glands are within normal limits. Kidneys demonstrate a normal enhancement pattern bilaterally. No renal calculi or obstructive changes are seen. Ureters are within normal limits. The bladder is partially distended. Stomach/Bowel: Colon is predominately decompressed. No inflammatory changes are seen. The appendix is within normal limits. No obstructive or inflammatory changes of the small bowel are seen. Stomach is within normal limits. Lymphatic: No sizable lymphadenopathy is noted. Reproductive: Prostate is unremarkable. Other: No abdominal wall hernia or abnormality. No abdominopelvic ascites. Musculoskeletal: No acute bony abnormality is noted. Review of the MIP images confirms the above findings. IMPRESSION: No evidence of aortic dissection in the chest or abdomen. No aneurysmal dilatation is noted. Mild peripancreatic inflammatory change in the region of the pancreatic head which may represent some focal pancreatitis. Stable 5 mm nodule in the right lower lobe unchanged from 2017 consistent with a benign etiology. No further follow-up is recommended. Fatty liver. Enhancing  lesion within the right lobe of the liver likely representing a hemangioma. Electronically Signed   By: Inez Catalina M.D.   On: 05/13/2021 20:43    Procedures Procedures   Medications Ordered in ED Medications  oxyCODONE-acetaminophen (PERCOCET/ROXICET) 5-325 MG per tablet 2 tablet (has no administration in time range)  sodium chloride 0.9 % bolus 1,000 mL (0 mLs Intravenous Stopped 05/13/21 2131)  0.9 %  sodium chloride infusion (0 mLs Intravenous Stopped 05/13/21 2202)  pantoprazole (PROTONIX) injection 40 mg (40 mg Intravenous Given 05/13/21 2033)  ondansetron (ZOFRAN) injection 4 mg (4 mg Intravenous Given 05/13/21 2031)  HYDROmorphone (DILAUDID) injection 1 mg (1 mg Intravenous Given 05/13/21 2032)  iohexol (OMNIPAQUE)  350 MG/ML injection 100 mL (100 mLs Intravenous Contrast Given 05/13/21 2011)  sodium chloride 0.9 % bolus 1,000 mL (1,000 mLs Intravenous New Bag/Given 05/13/21 2200)    ED Course  I have reviewed the triage vital signs and the nursing notes.  Pertinent labs & imaging results that were available during my care of the patient were reviewed by me and considered in my medical decision making (see chart for details).    MDM Rules/Calculators/A&P                           Patient has history of pancreatitis.  He started developing pain that was typical of prior pancreatitis episodes.  Patient is not having any vomiting.  He does not have fever.  Abdominal examination is soft.  CT scan shows some mild inflammatory changes and lipase is mildly elevated.  At this time, with no vomiting, no fever and limited changes on CT scan will trial outpatient management with strict dietary instructions and Percocet for pain.  Patient is clinically well in appearance.  He is rehydrated and pain controlled in the emergency department. Final Clinical Impression(s) / ED Diagnoses Final diagnoses:  Acute pancreatitis without infection or necrosis, unspecified pancreatitis type    Rx / DC  Orders ED Discharge Orders          Ordered    oxyCODONE-acetaminophen (PERCOCET) 5-325 MG tablet  Every 6 hours PRN        05/13/21 2257    ondansetron (ZOFRAN ODT) 4 MG disintegrating tablet  Every 4 hours PRN        05/13/21 2257             Charlesetta Shanks, MD 05/13/21 2301

## 2021-05-14 ENCOUNTER — Telehealth (HOSPITAL_BASED_OUTPATIENT_CLINIC_OR_DEPARTMENT_OTHER): Payer: Self-pay | Admitting: Student

## 2021-05-14 MED ORDER — TRAMADOL HCL 50 MG PO TABS: ORAL_TABLET | ORAL | 0 refills | Status: AC

## 2021-05-14 NOTE — Telephone Encounter (Signed)
Patient seen in the ED yesterday, had 2 narcotic prescriptions sent to him and the incorrect 1 was discontinued, patient will need to take tramadol rather than Percocet.  This has been resubmitted to the pharmacy and nursing called to discontinue Percocet prescription.

## 2021-05-19 DIAGNOSIS — R030 Elevated blood-pressure reading, without diagnosis of hypertension: Secondary | ICD-10-CM | POA: Diagnosis not present

## 2021-05-19 DIAGNOSIS — M47816 Spondylosis without myelopathy or radiculopathy, lumbar region: Secondary | ICD-10-CM | POA: Diagnosis not present

## 2021-06-01 DIAGNOSIS — M47816 Spondylosis without myelopathy or radiculopathy, lumbar region: Secondary | ICD-10-CM | POA: Diagnosis not present

## 2021-06-15 DIAGNOSIS — D1801 Hemangioma of skin and subcutaneous tissue: Secondary | ICD-10-CM | POA: Diagnosis not present

## 2021-06-15 DIAGNOSIS — L821 Other seborrheic keratosis: Secondary | ICD-10-CM | POA: Diagnosis not present

## 2021-06-15 DIAGNOSIS — L738 Other specified follicular disorders: Secondary | ICD-10-CM | POA: Diagnosis not present

## 2021-06-16 DIAGNOSIS — H43811 Vitreous degeneration, right eye: Secondary | ICD-10-CM | POA: Diagnosis not present

## 2021-06-16 DIAGNOSIS — H25813 Combined forms of age-related cataract, bilateral: Secondary | ICD-10-CM | POA: Diagnosis not present

## 2021-06-16 DIAGNOSIS — H04123 Dry eye syndrome of bilateral lacrimal glands: Secondary | ICD-10-CM | POA: Diagnosis not present

## 2021-06-16 DIAGNOSIS — H0102A Squamous blepharitis right eye, upper and lower eyelids: Secondary | ICD-10-CM | POA: Diagnosis not present

## 2021-10-05 DIAGNOSIS — Z86008 Personal history of in-situ neoplasm of other site: Secondary | ICD-10-CM | POA: Diagnosis not present

## 2021-10-05 DIAGNOSIS — L570 Actinic keratosis: Secondary | ICD-10-CM | POA: Diagnosis not present

## 2021-10-05 DIAGNOSIS — Z85828 Personal history of other malignant neoplasm of skin: Secondary | ICD-10-CM | POA: Diagnosis not present

## 2021-10-05 DIAGNOSIS — L578 Other skin changes due to chronic exposure to nonionizing radiation: Secondary | ICD-10-CM | POA: Diagnosis not present

## 2021-10-22 DIAGNOSIS — K219 Gastro-esophageal reflux disease without esophagitis: Secondary | ICD-10-CM | POA: Diagnosis not present

## 2021-10-22 DIAGNOSIS — Z125 Encounter for screening for malignant neoplasm of prostate: Secondary | ICD-10-CM | POA: Diagnosis not present

## 2021-10-22 DIAGNOSIS — M109 Gout, unspecified: Secondary | ICD-10-CM | POA: Diagnosis not present

## 2021-10-22 DIAGNOSIS — Z0000 Encounter for general adult medical examination without abnormal findings: Secondary | ICD-10-CM | POA: Diagnosis not present

## 2021-10-22 DIAGNOSIS — R945 Abnormal results of liver function studies: Secondary | ICD-10-CM | POA: Diagnosis not present

## 2021-10-22 DIAGNOSIS — E7800 Pure hypercholesterolemia, unspecified: Secondary | ICD-10-CM | POA: Diagnosis not present

## 2021-10-22 DIAGNOSIS — I10 Essential (primary) hypertension: Secondary | ICD-10-CM | POA: Diagnosis not present

## 2021-11-09 DIAGNOSIS — L03114 Cellulitis of left upper limb: Secondary | ICD-10-CM | POA: Diagnosis not present

## 2021-11-10 ENCOUNTER — Encounter: Payer: Self-pay | Admitting: Orthopedic Surgery

## 2021-11-10 ENCOUNTER — Ambulatory Visit: Payer: Medicare Other | Admitting: Orthopedic Surgery

## 2021-11-10 DIAGNOSIS — M7989 Other specified soft tissue disorders: Secondary | ICD-10-CM | POA: Insufficient documentation

## 2021-11-10 NOTE — Progress Notes (Signed)
? ?Office Visit Note ?  ?Patient: Corey Hawkins           ?Date of Birth: October 05, 1955           ?MRN: [id number] ?Visit Date: 11/10/2021 ?             ?Requested by: Gaynelle Arabian, MD ?[address] ?[address] ?[place],  [place] [address] ?PCP: Gaynelle Arabian, MD ? ? ?Assessment & Plan: ?Visit Diagnoses:  ?1. Swelling of left hand   ? ? ?Plan: Discussed with patient that it seems like his symptoms have improved for the last 24 hours.  He was previously unable make a fist but is now able make a full complete fist.  His erythema has resolved today.  Discussed with patient that he does not need any surgical I&D as there is no appreciable fluid collection.  He does not have any erythema or streaking that would suggest the need for IV antibiotics.  Discussed that he can continue the p.o. Cipro given that he has improved over the last 24 hours.  Discussed that I cannot guarantee that his infection will be resolved by the time he goes on his cruise will not worsen over the course of of the cruise.  We discussed that if he develops worrisome symptoms then he should be evaluated by the medical staff on the ship.   ? ?Follow-Up Instructions: No follow-ups on file.  ? ?Orders:  ?No orders of the defined types were placed in this encounter. ? ?No orders of the defined types were placed in this encounter. ? ? ? ? Procedures: ?No procedures performed ? ? ?Clinical Data: ?No additional findings. ? ? ?Subjective: ?Chief Complaint  ?Patient presents with  ? Left Hand - Injury  ?  DOI: 10/19/2021, + swelling,   ? ? ?This is a 66 year old right-hand-dominant male who presents for left hand swelling and erythema.  This all started approximately 3 weeks ago after he had a small cut at the left index finger at the level of the dorsal MP joint.  He then noticed some pain and swelling at the dorsal aspect of the hand.  He developed some streaking erythema to the level of the wrist crease and was told he had cellulitis.  He was given a  prescription of doxycycline for 10 days.  He completed this antibiotic course and started Keflex over the weekend until Monday given some continued swelling and erythema.  He was started on p.o. ciprofloxacin last night.  He notes that his swelling has continued to improve since starting the new antibiotic.  He was previously unable to make a full fist but is now able to make a complete tight fist.  He has no erythema today.  He denies any systemic symptoms today. ? ?Injury ? ? ?Review of Systems ? ? ?Objective: ?Vital Signs: BP (!) 165/92   Ht '5\' 11"'$  (1.803 m)   BMI 27.89 kg/m?  ? ?Physical Exam ?Constitutional:   ?   Appearance: Normal appearance.  ?Cardiovascular:  ?   Rate and Rhythm: Normal rate.  ?   Pulses: Normal pulses.  ?Pulmonary:  ?   Effort: Pulmonary effort is normal.  ?Skin: ?   General: Skin is warm and dry.  ?   Capillary Refill: Capillary refill takes less than 2 seconds.  ?Neurological:  ?   Mental Status: He is alert.  ? ? ?Left Hand Exam  ? ?Tenderness  ?The patient is experiencing no tenderness.  ? ?Range of Motion  ?  The patient has normal left wrist ROM. ? ?Other  ?Erythema: absent ?Sensation: normal ?Pulse: present ? ?Comments:  Mild to moderate dorsal hand swelling without erythema.  Able to make full fist and fully extend all fingers without pain.  No volar swelling.  No TTP.  No fluid collection.  No lymphangitis.  ? ? ? ? ?Specialty Comments:  ?No specialty comments available. ? ?Imaging: ?No results found. ? ? ?PMFS History: ?Patient Active Problem List  ? Diagnosis Date Noted  ? Swelling of left hand 11/10/2021  ? Back spasm 08/16/2015  ? Pancreatitis 08/14/2015  ? Hepatic lesion 08/14/2015  ? Essential hypertension 08/14/2015  ? GERD (gastroesophageal reflux disease) 08/14/2015  ? Hyponatremia 08/14/2015  ? Hyperglycemia 08/14/2015  ? Gout 08/14/2015  ? Pancreatitis, acute   ? ?Past Medical History:  ?Diagnosis Date  ? GERD (gastroesophageal reflux disease)   ? Hypertension   ?  Pancreatitis   ? Skin cancer   ?  ?Family History  ?Problem Relation Age of Onset  ? Gallbladder disease Mother   ? Gallbladder disease Father   ? Gallbladder disease Brother   ?  ?Past Surgical History:  ?Procedure Laterality Date  ? KNEE SURGERY    ? NECK SURGERY    ? ?Social History  ? ?Occupational History  ? Not on file  ?Tobacco Use  ? Smoking status: Never  ?  Passive exposure: Yes  ? Smokeless tobacco: Never  ?Substance and Sexual Activity  ? Alcohol use: Yes  ?  Comment: ocassional  ? Drug use: No  ? Sexual activity: Not on file  ? ? ? ? ? ? ?

## 2021-12-24 ENCOUNTER — Encounter: Payer: Self-pay | Admitting: Orthopedic Surgery

## 2021-12-24 ENCOUNTER — Ambulatory Visit: Payer: Medicare Other | Admitting: Orthopedic Surgery

## 2021-12-24 DIAGNOSIS — M7989 Other specified soft tissue disorders: Secondary | ICD-10-CM

## 2021-12-24 NOTE — Progress Notes (Signed)
Office Visit Note   Patient: Corey Hawkins           Date of Birth: 07/31/1956           MRN: [id number] Visit Date: 12/24/2021              Requested by: Gaynelle Arabian, MD [address] [place],  [place] [address] PCP: Gaynelle Arabian, MD   Assessment & Plan: Visit Diagnoses:  1. Swelling of left hand     Plan: Discussed with patient that I do not have a great explanation for his dorsal hand swelling/mass.  It seems to involve the index finger extensor tendon.  It is more firm and discrete than would be expected for extensor tenosynovitis.  He does note that the area will occasionally become erythematous, swollen, and painful.  I would like to get an MRI to further evaluate the lesion.  I will see him back in the office once the MRI is completed.  Follow-Up Instructions: No follow-ups on file.   Orders:  Orders Placed This Encounter  Procedures   MR HAND LEFT W CONTRAST   No orders of the defined types were placed in this encounter.     Procedures: No procedures performed   Clinical Data: No additional findings.   Subjective: Chief Complaint  Patient presents with   Left Hand - Follow-up    This is a 66 year old right-hand-dominant male who presents for follow-up of left hand swelling.  This initially started after he cut his index finger at the radial aspect of the MP joint on an ice machine.  It was a very superficial scrape that he had after a few days.  He subsequently developed a cellulitis which resolved with a course of ciprofloxacin.  He went on a cruise and had no issue with this hand.  This was approximately 6 weeks ago.  He notes that he has a swollen, firm area at the dorsal aspect of the hand in line with the index finger over the extensor tendon.  This has grown in size since his last visit.  He notes that the overlying skin will occasionally become erythematous and swollen.  He has no erythema today.  He also notes that he also experiences  occasional pain at the index finger MP joint with certain activities such as opening a jar or twisting a doorknob.  It is not bothering him much today.   Review of Systems   Objective: Vital Signs: There were no vitals taken for this visit.  Physical Exam Constitutional:      Appearance: Normal appearance.  Cardiovascular:     Rate and Rhythm: Normal rate.     Pulses: Normal pulses.  Pulmonary:     Effort: Pulmonary effort is normal.  Skin:    General: Skin is warm and dry.     Capillary Refill: Capillary refill takes less than 2 seconds.  Neurological:     Mental Status: He is alert.    Left Hand Exam   Tenderness  The patient is experiencing no tenderness.   Range of Motion  The patient has normal left wrist ROM.  Other  Erythema: absent Sensation: normal Pulse: present  Comments:  Firm, discrete, oblong swelling along what seems to involve the index finger extensor tendon. Approx 3 cm in length. Able to make a complete fist and fully extend fingers.  No instability or pain w/ palpation of index MCP joint. No erythema     Specialty Comments:  No specialty comments available.  Imaging: No results found.   PMFS History: Patient Active Problem List   Diagnosis Date Noted   Swelling of left hand 11/10/2021   Back spasm 08/16/2015   Pancreatitis 08/14/2015   Hepatic lesion 08/14/2015   Essential hypertension 08/14/2015   GERD (gastroesophageal reflux disease) 08/14/2015   Hyponatremia 08/14/2015   Hyperglycemia 08/14/2015   Gout 08/14/2015   Pancreatitis, acute    Past Medical History:  Diagnosis Date   GERD (gastroesophageal reflux disease)    Hypertension    Pancreatitis    Skin cancer     Family History  Problem Relation Age of Onset   Gallbladder disease Mother    Gallbladder disease Father    Gallbladder disease Brother     Past Surgical History:  Procedure Laterality Date   KNEE SURGERY     NECK SURGERY     Social History    Occupational History   Not on file  Tobacco Use   Smoking status: Never    Passive exposure: Yes   Smokeless tobacco: Never  Substance and Sexual Activity   Alcohol use: Yes    Comment: ocassional   Drug use: No   Sexual activity: Not on file

## 2022-01-08 ENCOUNTER — Ambulatory Visit
Admission: RE | Admit: 2022-01-08 | Discharge: 2022-01-08 | Disposition: A | Discharge status: Home / Self Care | Payer: Medicare Other | Source: Ambulatory Visit | Attending: Orthopedic Surgery | Admitting: Orthopedic Surgery

## 2022-01-08 DIAGNOSIS — M7989 Other specified soft tissue disorders: Secondary | ICD-10-CM

## 2022-01-08 DIAGNOSIS — R531 Weakness: Secondary | ICD-10-CM | POA: Diagnosis not present

## 2022-01-08 MED ORDER — GADOBENATE DIMEGLUMINE 529 MG/ML IV SOLN
18.0000 mL | Freq: Once | INTRAVENOUS | Status: AC | PRN
  Administered 2022-01-08: 18 mL via INTRAVENOUS

## 2022-01-14 ENCOUNTER — Ambulatory Visit (INDEPENDENT_AMBULATORY_CARE_PROVIDER_SITE_OTHER): Payer: Medicare Other | Admitting: Orthopedic Surgery

## 2022-01-14 DIAGNOSIS — S61402A Unspecified open wound of left hand, initial encounter: Secondary | ICD-10-CM | POA: Diagnosis not present

## 2022-01-14 DIAGNOSIS — S66822A Laceration of other specified muscles, fascia and tendons at wrist and hand level, left hand, initial encounter: Secondary | ICD-10-CM | POA: Diagnosis not present

## 2022-01-14 NOTE — Progress Notes (Signed)
Office Visit Note   Patient: Corey Hawkins           Date of Birth: 1955-12-28           MRN: [id number] Visit Date: 01/14/2022              Requested by: Gaynelle Arabian, MD [address] [place],  [place] [address] PCP: Gaynelle Arabian, MD   Assessment & Plan: Visit Diagnoses:  1. Extensor tendon laceration, hand, open wound, left, initial encounter     Plan: Patient presents today to review his recent MRI.  He was last seen on 5/18 which time he developed a focal swelling at the dorsal aspect of left hand in line with the index finger ray.  We reviewed his MRI which suggested that the swelling is the result of a laceration to the EIP tendon.  This makes sense given that his symptoms all began after he cut his index finger at the MP joint on an ice machine.  Interestingly, he still has full function of the index finger and can make a "hook 'em horns" which isolates the EIP tendon.  We discussed continued observation given his normal and unaffected hand function versus attempting to repair or excising this tendon stump.  He would like to continue to observe his symptoms for now as they are not bothersome for him.  I can see him back again as needed.  Follow-Up Instructions: No follow-ups on file.   Orders:  No orders of the defined types were placed in this encounter.  No orders of the defined types were placed in this encounter.     Procedures: No procedures performed   Clinical Data: No additional findings.   Subjective: Chief Complaint  Patient presents with   Left Hand - Follow-up    MRI Review    This is a 66 year old right-hand-dominant male presents for follow-up of the left hand swelling.  He initially cut his hand at the index MP joint on an ice machine.  He states this was a very superficial scrape.  He developed a mild cellulitis that resolved with oral antibiotics.  He was last seen on 5/18 at which time he had a focal swollen area at the dorsal  aspect of the hand in line with the index finger extensor.  This is largely nonbothersome for him.  He has no loss of hand function.  It only bothers him when he accidentally hits the dorsum of his hand on an object.  There is no associated erythema.    Review of Systems   Objective: Vital Signs: There were no vitals taken for this visit.  Physical Exam  Left Hand Exam   Tenderness  The patient is experiencing no tenderness.   Range of Motion  The patient has normal left wrist ROM.  Other  Erythema: absent Sensation: normal Pulse: present  Comments:  Full and painless function of the index finger.       Specialty Comments:  No specialty comments available.  Imaging: No results found.   PMFS History: Patient Active Problem List   Diagnosis Date Noted   Extensor tendon laceration, hand, open wound, left, initial encounter 01/14/2022   Swelling of left hand 11/10/2021   Back spasm 08/16/2015   Pancreatitis 08/14/2015   Hepatic lesion 08/14/2015   Essential hypertension 08/14/2015   GERD (gastroesophageal reflux disease) 08/14/2015   Hyponatremia 08/14/2015   Hyperglycemia 08/14/2015   Gout 08/14/2015   Pancreatitis, acute  Past Medical History:  Diagnosis Date   GERD (gastroesophageal reflux disease)    Hypertension    Pancreatitis    Skin cancer     Family History  Problem Relation Age of Onset   Gallbladder disease Mother    Gallbladder disease Father    Gallbladder disease Brother     Past Surgical History:  Procedure Laterality Date   KNEE SURGERY     NECK SURGERY     Social History   Occupational History   Not on file  Tobacco Use   Smoking status: Never    Passive exposure: Yes   Smokeless tobacco: Never  Substance and Sexual Activity   Alcohol use: Yes    Comment: ocassional   Drug use: No   Sexual activity: Not on file

## 2022-02-02 DIAGNOSIS — H10823 Rosacea conjunctivitis, bilateral: Secondary | ICD-10-CM | POA: Diagnosis not present

## 2022-02-04 ENCOUNTER — Encounter (HOSPITAL_BASED_OUTPATIENT_CLINIC_OR_DEPARTMENT_OTHER): Payer: Self-pay | Admitting: Urology

## 2022-02-04 ENCOUNTER — Emergency Department (HOSPITAL_BASED_OUTPATIENT_CLINIC_OR_DEPARTMENT_OTHER): Payer: Medicare Other

## 2022-02-04 ENCOUNTER — Emergency Department (HOSPITAL_BASED_OUTPATIENT_CLINIC_OR_DEPARTMENT_OTHER)
Admission: EM | Admit: 2022-02-04 | Discharge: 2022-02-04 | Disposition: A | Discharge status: Home / Self Care | Payer: Medicare Other | Attending: Emergency Medicine | Admitting: Emergency Medicine

## 2022-02-04 ENCOUNTER — Other Ambulatory Visit: Payer: Self-pay

## 2022-02-04 DIAGNOSIS — M79605 Pain in left leg: Secondary | ICD-10-CM | POA: Diagnosis not present

## 2022-02-04 DIAGNOSIS — M79662 Pain in left lower leg: Secondary | ICD-10-CM

## 2022-02-04 NOTE — Discharge Instructions (Signed)
Today you were evaluated in the emergency room for left calf pain.  Your ultrasound of the leg was negative for a DVT, meaning there was no clot in your leg.  This is good news!  I believe your left calf pain is due to a musculoskeletal injury, likely to the gastrocnemius muscle.  Please use rest, ice, elevation, and anti-inflammatory medications such as Motrin or ibuprofen.   Follow-up with your primary care doctor.

## 2022-02-04 NOTE — ED Triage Notes (Signed)
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

## 2022-02-04 NOTE — ED Provider Notes (Signed)
[facility] EMERGENCY DEPARTMENT Provider Note   CSN: [id number] Arrival date & time: 02/04/22  1239  History  Chief Complaint  Patient presents with   Leg Pain   Corey Hawkins is a 66 y.o. male.  66 year old male presents with left posterior calf pain x1 week.  He reports left calf pain extending from the Achilles tendon proximally to the gastrocnemius starting Saturday.  He initially thought this was gout as Friday night he had a steak and beer, so he started treatment with colchicine.  Of note, he does take allopurinol daily to prevent gout.  The calf pain has continued constant without much improvement from the allopurinol and colchicine.  Pain worse with dorsiflexion and walking, although he does report pain gets better after about 10 minutes of walking.  He is still able to ambulate well, just came from [place] where he walked around the entire store.  Is still doing daily treadmill walking.  Denies leg redness, swelling, asymmetry, warmth.  He is having no other symptoms.  Denies shortness of breath, palpitations, tachycardia.  Home Medications Prior to Admission medications   Medication Sig Start Date End Date Taking? Authorizing Provider  allopurinol (ZYLOPRIM) 300 MG tablet Take 300 mg by mouth daily. 08/11/15   [provider]  co-enzyme Q-10 30 MG capsule Take 30 mg by mouth daily.     [provider]  diazepam (VALIUM) 5 MG tablet Take 1 tablet (5 mg total) by mouth every 8 (eight) hours as needed for muscle spasms. 08/17/15   Delfina Redwood, MD  gabapentin (NEURONTIN) 300 MG capsule Take 300 mg by mouth 4 (four) times daily.    [provider]  HYDROmorphone (DILAUDID) 2 MG tablet Take 1 tablet (2 mg total) by mouth every 4 (four) hours as needed for severe pain. 08/17/15   Delfina Redwood, MD  lisinopril (PRINIVIL,ZESTRIL) 2.5 MG tablet Take 2.5 mg by mouth daily.    [provider]  Omega-3 Fatty Acids (FISH OIL PO) Take 1 tablet  by mouth daily.     [provider]  omeprazole (PRILOSEC) 20 MG capsule Take 20 mg by mouth daily.    [provider]  ondansetron (ZOFRAN ODT) 4 MG disintegrating tablet Take 1 tablet (4 mg total) by mouth every 4 (four) hours as needed for nausea or vomiting. 05/13/21   Charlesetta Shanks, MD  traMADol Veatrice Bourbon) 50 MG tablet 1-2 tablets every 6 hours as needed for pain 05/14/21   Jacqlyn Larsen, PA-C     Allergies    Vibramycin [doxycycline hyclate] and Penicillins    Review of Systems   Review of Systems  Constitutional:  Negative for activity change, chills, diaphoresis, fatigue and fever.  Respiratory:  Negative for cough, chest tightness and wheezing.   Cardiovascular:  Negative for chest pain, palpitations and leg swelling.  Musculoskeletal:  Negative for gait problem.       Right posterior calf pain   Physical Exam Updated Vital Signs BP (!) 136/95   Pulse (!) 58   Temp 97.6 F (36.4 C) (Oral)   Resp 18   Ht '5\' 11"'$  (1.803 m)   Wt 91.6 kg   SpO2 95%   BMI 28.17 kg/m  Physical Exam Vitals and nursing note reviewed.  Constitutional:      General: He is not in acute distress.    Appearance: Normal appearance. He is normal weight. He is not ill-appearing, toxic-appearing or diaphoretic.  Musculoskeletal:  General: Tenderness (over gastroc while dorsiflexed) present. No swelling, deformity or signs of injury. Normal range of motion.     Comments: Left Achilles tendon intact on palpation  Skin:    General: Skin is warm.     Capillary Refill: Capillary refill takes less than 2 seconds.  Neurological:     General: No focal deficit present.     Mental Status: He is alert. Mental status is at baseline.   ED Results / Procedures / Treatments   Labs (all labs ordered are listed, but only abnormal results are displayed) Labs Reviewed - No data to display  EKG None  Radiology  EXAM: LEFT LOWER EXTREMITY VENOUS DOPPLER ULTRASOUND    TECHNIQUE: Gray-scale sonography with compression, as well as color and duplex ultrasound, were performed to evaluate the deep venous system(s) from the level of the common femoral vein through the popliteal and proximal calf veins.   COMPARISON:  None Available.   FINDINGS: VENOUS   Normal compressibility of the common femoral, superficial femoral, and popliteal veins, as well as the visualized calf veins. Visualized portions of profunda femoral vein and great saphenous vein unremarkable. No filling defects to suggest DVT on grayscale or color Doppler imaging. Doppler waveforms show normal direction of venous flow, normal respiratory plasticity and response to augmentation.   Limited views of the contralateral common femoral vein are unremarkable.   OTHER   None.   Limitations: none   IMPRESSION: Negative.  Procedures Procedures   Medications Ordered in ED Medications - No data to display  ED Course/ Medical Decision Making/ A&P                           Medical Decision Making 66 year old male with PMH provoked DVT x2 presents with left posterior calf pain x1 week.  Differential diagnoses include DVT, gastrocnemius injury, Achilles injury.  LLE DVT US negative for acute DVT. Suspect MSK etiology likely of the gastrocnemius, recommend follow-up with PCP or sports medicine.  Return precautions given, see AVS for more.  Amount and/or Complexity of Data Reviewed Radiology: ordered.    Details: DVT ultrasound of lower extremity   Final Clinical Impression(s) / ED Diagnoses Final diagnoses:  Pain of left calf   Rx / DC Orders ED Discharge Orders     None      Ezequiel Essex, MD   Ezequiel Essex, MD 63/01/60 1093    Lianne Cure, DO 23/55/73 0003

## 2022-02-04 NOTE — ED Notes (Signed)
US at bedside

## 2022-03-30 DIAGNOSIS — M1712 Unilateral primary osteoarthritis, left knee: Secondary | ICD-10-CM | POA: Diagnosis not present

## 2022-03-30 DIAGNOSIS — M778 Other enthesopathies, not elsewhere classified: Secondary | ICD-10-CM | POA: Diagnosis not present

## 2022-03-30 DIAGNOSIS — S46011A Strain of muscle(s) and tendon(s) of the rotator cuff of right shoulder, initial encounter: Secondary | ICD-10-CM | POA: Diagnosis not present

## 2022-04-02 DIAGNOSIS — M25511 Pain in right shoulder: Secondary | ICD-10-CM | POA: Diagnosis not present

## 2022-04-05 DIAGNOSIS — Z85828 Personal history of other malignant neoplasm of skin: Secondary | ICD-10-CM | POA: Diagnosis not present

## 2022-04-05 DIAGNOSIS — C44719 Basal cell carcinoma of skin of left lower limb, including hip: Secondary | ICD-10-CM | POA: Diagnosis not present

## 2022-04-05 DIAGNOSIS — C44311 Basal cell carcinoma of skin of nose: Secondary | ICD-10-CM | POA: Diagnosis not present

## 2022-04-05 DIAGNOSIS — L718 Other rosacea: Secondary | ICD-10-CM | POA: Diagnosis not present

## 2022-04-05 DIAGNOSIS — L578 Other skin changes due to chronic exposure to nonionizing radiation: Secondary | ICD-10-CM | POA: Diagnosis not present

## 2022-04-05 DIAGNOSIS — C44712 Basal cell carcinoma of skin of right lower limb, including hip: Secondary | ICD-10-CM | POA: Diagnosis not present

## 2022-04-05 DIAGNOSIS — Z86008 Personal history of in-situ neoplasm of other site: Secondary | ICD-10-CM | POA: Diagnosis not present

## 2022-04-06 DIAGNOSIS — M25511 Pain in right shoulder: Secondary | ICD-10-CM | POA: Diagnosis not present

## 2022-04-13 DIAGNOSIS — S46011A Strain of muscle(s) and tendon(s) of the rotator cuff of right shoulder, initial encounter: Secondary | ICD-10-CM | POA: Diagnosis not present

## 2022-04-13 DIAGNOSIS — M1712 Unilateral primary osteoarthritis, left knee: Secondary | ICD-10-CM | POA: Diagnosis not present

## 2022-04-20 DIAGNOSIS — M1712 Unilateral primary osteoarthritis, left knee: Secondary | ICD-10-CM | POA: Diagnosis not present

## 2022-04-27 DIAGNOSIS — M1712 Unilateral primary osteoarthritis, left knee: Secondary | ICD-10-CM | POA: Diagnosis not present

## 2022-05-03 DIAGNOSIS — C44311 Basal cell carcinoma of skin of nose: Secondary | ICD-10-CM | POA: Diagnosis not present

## 2022-05-10 DIAGNOSIS — M1712 Unilateral primary osteoarthritis, left knee: Secondary | ICD-10-CM | POA: Diagnosis not present

## 2022-06-14 DIAGNOSIS — E663 Overweight: Secondary | ICD-10-CM | POA: Diagnosis not present

## 2022-06-14 DIAGNOSIS — L738 Other specified follicular disorders: Secondary | ICD-10-CM | POA: Diagnosis not present

## 2022-06-14 DIAGNOSIS — Z85828 Personal history of other malignant neoplasm of skin: Secondary | ICD-10-CM | POA: Diagnosis not present

## 2022-06-22 DIAGNOSIS — H5203 Hypermetropia, bilateral: Secondary | ICD-10-CM | POA: Diagnosis not present

## 2022-07-15 ENCOUNTER — Emergency Department (HOSPITAL_BASED_OUTPATIENT_CLINIC_OR_DEPARTMENT_OTHER)
Admission: EM | Admit: 2022-07-15 | Discharge: 2022-07-15 | Disposition: A | Discharge status: Home / Self Care | Payer: Medicare Other | Attending: Emergency Medicine | Admitting: Emergency Medicine

## 2022-07-15 ENCOUNTER — Encounter (HOSPITAL_BASED_OUTPATIENT_CLINIC_OR_DEPARTMENT_OTHER): Payer: Self-pay

## 2022-07-15 ENCOUNTER — Emergency Department (HOSPITAL_BASED_OUTPATIENT_CLINIC_OR_DEPARTMENT_OTHER): Payer: Medicare Other

## 2022-07-15 DIAGNOSIS — G2581 Restless legs syndrome: Secondary | ICD-10-CM | POA: Insufficient documentation

## 2022-07-15 DIAGNOSIS — R1012 Left upper quadrant pain: Secondary | ICD-10-CM | POA: Insufficient documentation

## 2022-07-15 DIAGNOSIS — R001 Bradycardia, unspecified: Secondary | ICD-10-CM | POA: Diagnosis not present

## 2022-07-15 DIAGNOSIS — Z79899 Other long term (current) drug therapy: Secondary | ICD-10-CM | POA: Insufficient documentation

## 2022-07-15 DIAGNOSIS — I10 Essential (primary) hypertension: Secondary | ICD-10-CM | POA: Insufficient documentation

## 2022-07-15 DIAGNOSIS — R109 Unspecified abdominal pain: Secondary | ICD-10-CM | POA: Diagnosis not present

## 2022-07-15 DIAGNOSIS — I700 Atherosclerosis of aorta: Secondary | ICD-10-CM | POA: Diagnosis not present

## 2022-07-15 DIAGNOSIS — Z85828 Personal history of other malignant neoplasm of skin: Secondary | ICD-10-CM | POA: Diagnosis not present

## 2022-07-15 LAB — CBC WITH DIFFERENTIAL/PLATELET
Abs Immature Granulocytes: 0.01 10*3/uL (ref 0.00–0.07)
Basophils Absolute: 0.1 10*3/uL (ref 0.0–0.1)
Basophils Relative: 1 %
Eosinophils Absolute: 0.2 10*3/uL (ref 0.0–0.5)
Eosinophils Relative: 4 %
HCT: 40.7 % (ref 39.0–52.0)
Hemoglobin: 13.8 g/dL (ref 13.0–17.0)
Immature Granulocytes: 0 %
Lymphocytes Relative: 33 %
Lymphs Abs: 1.9 10*3/uL (ref 0.7–4.0)
MCH: 31.2 pg (ref 26.0–34.0)
MCHC: 33.9 g/dL (ref 30.0–36.0)
MCV: 92.1 fL (ref 80.0–100.0)
Monocytes Absolute: 0.5 10*3/uL (ref 0.1–1.0)
Monocytes Relative: 8 %
Neutro Abs: 3 10*3/uL (ref 1.7–7.7)
Neutrophils Relative %: 54 %
Platelets: 197 10*3/uL (ref 150–400)
RBC: 4.42 MIL/uL (ref 4.22–5.81)
RDW: 12.7 % (ref 11.5–15.5)
WBC: 5.6 10*3/uL (ref 4.0–10.5)
nRBC: 0 % (ref 0.0–0.2)

## 2022-07-15 LAB — COMPREHENSIVE METABOLIC PANEL
ALT: 24 U/L (ref 0–44)
AST: 27 U/L (ref 15–41)
Albumin: 3.7 g/dL (ref 3.5–5.0)
Alkaline Phosphatase: 65 U/L (ref 38–126)
Anion gap: 5 (ref 5–15)
BUN: 20 mg/dL (ref 8–23)
CO2: 26 mmol/L (ref 22–32)
Calcium: 8.9 mg/dL (ref 8.9–10.3)
Chloride: 107 mmol/L (ref 98–111)
Creatinine, Ser: 0.86 mg/dL (ref 0.61–1.24)
GFR, Estimated: >60 mL/min (ref >60)
Glucose, Bld: 108 mg/dL — ABNORMAL HIGH (ref 70–99)
Potassium: 4.1 mmol/L (ref 3.5–5.1)
Sodium: 138 mmol/L (ref 135–145)
Total Bilirubin: 0.9 mg/dL (ref 0.3–1.2)
Total Protein: 6.1 g/dL — ABNORMAL LOW (ref 6.5–8.1)

## 2022-07-15 LAB — LIPASE, BLOOD: Lipase: 43 U/L (ref 11–51)

## 2022-07-15 LAB — LACTIC ACID, PLASMA: Lactic Acid, Venous: 0.9 mmol/L (ref 0.5–1.9)

## 2022-07-15 LAB — MAGNESIUM: Magnesium: 1.9 mg/dL (ref 1.7–2.4)

## 2022-07-15 MED ORDER — IOHEXOL 300 MG/ML  SOLN
100.0000 mL | Freq: Once | INTRAMUSCULAR | Status: AC | PRN
  Administered 2022-07-15: 100 mL via INTRAVENOUS

## 2022-07-15 NOTE — ED Triage Notes (Signed)
C/o left upper abdominal pain radiating to back x 2 weeks. Hx of pancreatitis, states has just been drinking liquids and eating bland food. Denies N/V/D.

## 2022-07-15 NOTE — ED Provider Notes (Addendum)
[name]   CSN: [id number] Arrival date & time: 07/15/22  0907     History  Chief Complaint  Patient presents with   Abdominal Pain    Corey Hawkins is a 66 y.o. 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   C/o left upper abdominal pain radiating to back x 3-4 weeks. He has had relapsing-remitting symptoms for several years.  He states he feels like about every month he has a "pancreatitis issue."  He has been admitted once in 2017 and evaluated for gallstones which were negative.  He does not drink alcohol.  Patient states this episode has been going on since 2 weeks before Thanksgiving, he has never had this ongoing episode before.  He fasted for 5 days and had a home IV team, and give him a total of 4 L of fluid over the course of 2 days.  On the 6-day he was able to tolerate p.o.  He states that right now his pain is only rated 1-2 out of 10 but it is ongoing and worse with eating.  Has been eating a bland diet of rice, bread, beans.  He has an appointment scheduled with GI on January 16 but wanted to come in to be evaluated for gallstones or cancer.  Denies any fever/chills, chest pain, shortness of breath, urinary symptoms, diarrhea, melena/hematochezia though he does endorse chronic constipation.  He notes that his stool softeners like MiraLAX flareup his pancreatitis symptoms so he is unable to take them.    Abdominal Pain      Home Medications Prior to Admission medications   Medication Sig Start Date End Date Taking? Authorizing Provider  allopurinol (ZYLOPRIM) 300 MG tablet Take 300 mg by mouth daily. 08/11/15   [provider]  co-enzyme Q-10 30 MG capsule Take 30 mg by mouth daily.     [provider]  diazepam (VALIUM) 5 MG tablet Take 1 tablet (5 mg total) by mouth every 8 (eight) hours as needed for muscle spasms. 08/17/15   Delfina Redwood, MD  gabapentin (NEURONTIN) 300 MG capsule Take 300 mg by mouth 4 (four) times daily.    [provider]  HYDROmorphone (DILAUDID) 2 MG tablet Take 1 tablet (2 mg total) by mouth every 4 (four) hours as needed for severe pain. 08/17/15   Delfina Redwood, MD  lisinopril (PRINIVIL,ZESTRIL) 2.5 MG tablet Take 2.5 mg by mouth daily.    [provider]  Omega-3 Fatty Acids (FISH OIL PO) Take 1 tablet by mouth daily.     [provider]  omeprazole (PRILOSEC) 20 MG capsule Take 20 mg by mouth daily.    [provider]  ondansetron (ZOFRAN ODT) 4 MG disintegrating tablet Take 1 tablet (4 mg total) by mouth every 4 (four) hours as needed for nausea or vomiting. 05/13/21   Charlesetta Shanks, MD  traMADol Veatrice Bourbon) 50 MG tablet 1-2 tablets every 6 hours as needed for pain 05/14/21   Jacqlyn Larsen, PA-C      Allergies    Vibramycin [doxycycline hyclate] and Penicillins    Review of Systems   Review of Systems  Gastrointestinal:  Positive for abdominal pain.   Review of systems Negative for f/c.  A 10 point review of systems was performed and is negative unless otherwise reported in HPI.  Physical Exam Updated Vital Signs BP 139/79   Pulse (!) 58   Temp  98.4 F (36.9 C) (Oral)   Resp 12   Ht '5\' 11"'$  (1.803 m)   Wt 88.9 kg   SpO2 95%   BMI 27.34 kg/m  Physical Exam General: Normal appearing male, lying in bed.  HEENT: PERRLA, Sclera anicteric, MMM, trachea midline.  Cardiology: RRR, no murmurs/rubs/gallops. BL radial and DP pulses equal bilaterally.  Resp: Normal respiratory rate and effort. CTAB, no wheezes, rhonchi, crackles.  Abd: Soft, non-tender, non-distended. No rebound tenderness or guarding.  GU: Deferred. MSK: No peripheral edema or signs of trauma. Extremities without deformity or TTP. No cyanosis or clubbing. Skin: warm, dry. No rashes or lesions. Back: No CVA tenderness Neuro: A&Ox4, CNs II-XII grossly intact. MAEs. Sensation grossly  intact.  Psych: Normal mood and affect.   ED Results / Procedures / Treatments   Labs (all labs ordered are listed, but only abnormal results are displayed) Labs Reviewed  COMPREHENSIVE METABOLIC PANEL - Abnormal; Notable for the following components:      Result Value   Glucose, Bld 108 (*)    Total Protein 6.1 (*)    All other components within normal limits  CBC WITH DIFFERENTIAL/PLATELET  LACTIC ACID, PLASMA  MAGNESIUM  LIPASE, BLOOD  LACTIC ACID, PLASMA    EKG EKG Interpretation  Date/Time:  Thursday July 15 2022 10:39:26 EST Ventricular Rate:  61 PR Interval:  147 QRS Duration: 105 QT Interval:  428 QTC Calculation: 432 R Axis:   6 Text Interpretation: Sinus rhythm Confirmed by Cindee Lame [phone]) on 07/15/2022 11:18:40 AM  Radiology CT ABDOMEN PELVIS W CONTRAST  Result Date: 07/15/2022 CLINICAL DATA:  Acute severe abdominal pain. EXAM: CT ABDOMEN AND PELVIS WITH CONTRAST TECHNIQUE: Multidetector CT imaging of the abdomen and pelvis was performed using the standard protocol following bolus administration of intravenous contrast. RADIATION DOSE REDUCTION: This exam was performed according to the departmental dose-optimization program which includes automated exposure control, adjustment of the mA and/or kV according to patient size and/or use of iterative reconstruction technique. CONTRAST:  124m OMNIPAQUE IOHEXOL 300 MG/ML  SOLN COMPARISON:  CT examination dated May 13, 2022 FINDINGS: Lower chest: No acute abnormality. Hepatobiliary: No focal liver abnormality is seen. No gallstones, gallbladder wall thickening, or biliary dilatation. Pancreas: Unremarkable. No pancreatic ductal dilatation or surrounding inflammatory changes. Spleen: Normal in size without focal abnormality. Adrenals/Urinary Tract: Adrenal glands are unremarkable. Kidneys are normal, without renal calculi, focal lesion, or hydronephrosis. Bladder is unremarkable. Stomach/Bowel: Stomach is within  normal limits. Appendix appears normal. No evidence of bowel wall thickening, distention, or inflammatory changes. Vascular/Lymphatic: Aortic atherosclerosis. No enlarged abdominal or pelvic lymph nodes. Reproductive: Prostate is unremarkable. Other: No abdominal wall hernia or abnormality. No abdominopelvic ascites. 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 No acute osseous abnormality. IMPRESSION: 1. No CT evidence of acute abdominal/pelvic process. 2. No evidence of acute pancreatitis. 3. Normal appendix. No evidence of colitis or diverticulitis. 4. 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 5. Aortic atherosclerosis. Aortic Atherosclerosis (ICD10-I70.0). Electronically Signed   By: IKeane PoliceD.O.   On: 07/15/2022 10:59    Procedures Procedures    Medications Ordered in ED Medications  iohexol (OMNIPAQUE) 300 MG/ML solution 100 mL (100 mLs Intravenous Contrast Given 07/15/22 1041)    ED Course/ Medical Decision Making/ A&P  Medical Decision Making Amount and/or Complexity of Data Reviewed Labs: ordered. Decision-making details documented in ED Course. Radiology: ordered. Decision-making details documented in ED Course.  Risk Prescription drug management.    This patient presents to the ED for concern of abdominal pain, this involves an extensive number of treatment options, and is a complaint that carries with it a high risk of complications and morbidity.  I considered the following differential and admission for this acute, potentially life threatening condition.   MDM:    Patient with concern for ongoing recurrent pancreatitis. This episode has been severe and longer lasting than normal. Will evaluate with labs, imaging. Consider electrolyte abnormalities, renal injury from dehydration.  Patient is hemodynamically stable and overall  well-appearing however, low concern for hypovolemic shock.  Consider GERD/gastritis with pain in the left upper quadrant worsening with eating. Also consider chronic mesenteric ischemia given abdominal pain worse with eating chronically. Lower c/f SBO/ileus though patient does endorse chronic constipation.  Patient is concerned about malignancy with chronic abdominal pain, also a possibility.  He had a colonoscopy a few years ago that was normal.  Discussed with patient that likely if his labs imaging are okay and pain is controlled today he will go home with instructions to continue with GI follow-up.  Patient is in agreement with this plan.  Clinical Course as of 07/15/22 1134  Thu Jul 15, 2022  1104 Lipase: 43 [HN]  1104 Magnesium: 1.9 [HN]  1104 Lactic Acid, Venous: 0.9 [HN]  1104 CMP, CBC unremarkable [HN]  1104 CT ABDOMEN PELVIS W CONTRAST IMPRESSION: 1. No CT evidence of acute abdominal/pelvic process. 2. No evidence of acute pancreatitis. 3. Normal appendix. No evidence of colitis or diverticulitis. 4. 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 5. Aortic atherosclerosis.   [HN]  2229 Discussed with patient his negative workup.  Patient would like to be discharged at this time.  Patient is encouraged to call his gastroenterologist to see if he get a sooner appointment but otherwise is instructed to follow-up on 08/24/2022.  Patient is encouraged take a daily multivitamin if he is going to eat a bland diet to help avoid vitamin deficiencies.  Encouraged to stay well-hydrated and take stool softeners or eat a high-fiber diet as possible to avoid constipation.  He is also already taking an antacid.  Patient is given discharge instructions and return precautions, all questions answered to patient satisfaction. [HN]    Clinical Course User Index [HN] Audley Hose, MD     Labs: I Ordered, and personally interpreted labs.  The  pertinent results include: Unremarkable LFTs, electrolytes, lactate, CBC/CMP.  Imaging Studies ordered: I ordered imaging studies including pelvis which is normal. I independently visualized and interpreted imaging. I agree with the radiologist interpretation  Additional history obtained from chart review.   Cardiac Monitoring: The patient was maintained on a cardiac monitor.  I personally viewed and interpreted the cardiac monitored which showed an underlying rhythm of: NSR to mild sinus bradycardia, asympatomatic  Reevaluation: After the interventions noted above, I reevaluated the patient and found that they have :stayed the same  Social Determinants of Health:  patient lives independently  Disposition: DC with discharge instructions and return precautions.  Co morbidities that complicate the patient evaluation  Past Medical History:  Diagnosis Date   GERD (gastroesophageal reflux disease)    Hypertension    Pancreatitis    Skin cancer      Medicines Meds ordered this  encounter  Medications   iohexol (OMNIPAQUE) 300 MG/ML solution 100 mL    I have reviewed the patients home medicines and have made adjustments as needed  Problem List / ED Course: Problem List Items Addressed This Visit   None Visit Diagnoses     Left upper quadrant abdominal pain    -  Primary               This note was created using dictation software, which may contain spelling or grammatical errors.    Audley Hose, MD 07/15/22 1110    Audley Hose, MD 07/15/22 1134

## 2022-07-15 NOTE — Discharge Instructions (Addendum)
Thank you for coming to [facility] Emergency Department. You were seen for abdominal pain. We did an exam, labs, and imaging, and these showed no acute findings.  Please follow-up with your gastroenterologist as originally planned on 08/24/2022.  Call them today to see if you get a sooner appointment. Please take pepcid 20 mg twice per day for an antacid, which may help your symptoms. Please follow up with your primary care provider within 1-2 weeks.   Do not hesitate to return to the ED or call 911 if you experience: -Worsening symptoms -Lightheadedness, passing out -Fevers/chills -Anything else that concerns you

## 2022-07-23 ENCOUNTER — Emergency Department (HOSPITAL_BASED_OUTPATIENT_CLINIC_OR_DEPARTMENT_OTHER)
Admission: EM | Admit: 2022-07-23 | Discharge: 2022-07-23 | Disposition: A | Discharge status: Home / Self Care | Payer: Medicare Other | Attending: Emergency Medicine | Admitting: Emergency Medicine

## 2022-07-23 ENCOUNTER — Encounter (HOSPITAL_BASED_OUTPATIENT_CLINIC_OR_DEPARTMENT_OTHER): Payer: Self-pay

## 2022-07-23 ENCOUNTER — Other Ambulatory Visit: Payer: Self-pay

## 2022-07-23 DIAGNOSIS — I82612 Acute embolism and thrombosis of superficial veins of left upper extremity: Secondary | ICD-10-CM | POA: Diagnosis not present

## 2022-07-23 DIAGNOSIS — I808 Phlebitis and thrombophlebitis of other sites: Secondary | ICD-10-CM

## 2022-07-23 DIAGNOSIS — M79602 Pain in left arm: Secondary | ICD-10-CM | POA: Diagnosis present

## 2022-07-23 NOTE — ED Provider Notes (Signed)
[facility] EMERGENCY DEPARTMENT Provider Note   CSN: [id number] Arrival date & time: 07/23/22  4081     History  Chief Complaint  Patient presents with   Arm Pain    Corey Hawkins is a 66 y.o. male.  Patient here with bruising and some swelling around his left forearm where IV was from last week.  Denies any fevers or chills.  Nothing makes it worse or better.  Denies any significant pain.  He is not on any blood thinners.  No weakness or numbness.  The history is provided by the patient.       Home Medications Prior to Admission medications   Medication Sig Start Date End Date Taking? Authorizing Provider  allopurinol (ZYLOPRIM) 300 MG tablet Take 300 mg by mouth daily. 08/11/15   [provider]  co-enzyme Q-10 30 MG capsule Take 30 mg by mouth daily.     [provider]  diazepam (VALIUM) 5 MG tablet Take 1 tablet (5 mg total) by mouth every 8 (eight) hours as needed for muscle spasms. 08/17/15   Delfina Redwood, MD  gabapentin (NEURONTIN) 300 MG capsule Take 300 mg by mouth 4 (four) times daily.    [provider]  HYDROmorphone (DILAUDID) 2 MG tablet Take 1 tablet (2 mg total) by mouth every 4 (four) hours as needed for severe pain. 08/17/15   Delfina Redwood, MD  lisinopril (PRINIVIL,ZESTRIL) 2.5 MG tablet Take 2.5 mg by mouth daily.    [provider]  Omega-3 Fatty Acids (FISH OIL PO) Take 1 tablet by mouth daily.     [provider]  omeprazole (PRILOSEC) 20 MG capsule Take 20 mg by mouth daily.    [provider]  ondansetron (ZOFRAN ODT) 4 MG disintegrating tablet Take 1 tablet (4 mg total) by mouth every 4 (four) hours as needed for nausea or vomiting. 05/13/21   Charlesetta Shanks, MD  traMADol Veatrice Bourbon) 50 MG tablet 1-2 tablets every 6 hours as needed for pain 05/14/21   Jacqlyn Larsen, PA-C      Allergies    Vibramycin [doxycycline hyclate] and Penicillins    Review of Systems   Review of  Systems  Physical Exam Updated Vital Signs BP (!) 148/94 (BP Location: Right Arm)   Pulse 74   Temp 97.6 F (36.4 C) (Oral)   Resp 16   SpO2 99%  Physical Exam Vitals and nursing note reviewed.  Constitutional:      General: He is not in acute distress.    Appearance: He is well-developed.  Cardiovascular:     Rate and Rhythm: Normal rate and regular rhythm.     Pulses: Normal pulses.     Heart sounds: No murmur heard. Pulmonary:     Effort: Pulmonary effort is normal.  Musculoskeletal:        General: No swelling.     Cervical back: Neck supple.  Skin:    General: Skin is warm and dry.     Capillary Refill: Capillary refill takes less than 2 seconds.     Findings: Bruising present.     Comments: Patient with a little bit of bruising and yellowish changing of the skin and the dorsum of the left forearm, can palpate a little nodular process on the back of the forearm over this bruising site  Neurological:     Mental Status: He is alert.     Sensory: No sensory deficit.     Motor: No weakness.  Psychiatric:        Mood and Affect: Mood normal.     ED Results / Procedures / Treatments   Labs (all labs ordered are listed, but only abnormal results are displayed) Labs Reviewed - No data to display  EKG None  Radiology No results found.  Procedures Procedures    Medications Ordered in ED Medications - No data to display  ED Course/ Medical Decision Making/ A&P                           Medical Decision Making  Corey Hawkins is here for evaluation of bruising to the left forearm.  Normal vitals.  No fever.  Patient with some bruising around left forearm IV site from last week.  On the dorsum/back of the left forearm where patient had an IV last week there is some bruising and yellowing of the skin.  There is a small palpated nodule process in this area as well.  Suspect that this is a superficial thrombophlebitis going through stages of healing.  This is a  superficial vein.  I am not concerned about DVT.  This is on the dorsum/back portion of the left forearm.  Overall there is no signs of infectious process.  He is neurovascular neuromuscular intact.  He is got good pulses.  Overall given reassurance and recommend NSAIDs.  Discharged in good condition.  Understands return precautions.  This chart was dictated using voice recognition software.  Despite best efforts to proofread,  errors can occur which can change the documentation meaning.         Final Clinical Impression(s) / ED Diagnoses Final diagnoses:  Superficial thrombophlebitis of left upper extremity    Rx / DC Orders ED Discharge Orders     None         Lennice Sites, DO 07/23/22 7425

## 2022-07-23 NOTE — Discharge Instructions (Signed)
Recommend 400 mg of ibuprofen every 8 hours as needed.  This should improve with time.  If you develop any signs of infection please have healthcare provider evaluate.

## 2022-07-23 NOTE — ED Triage Notes (Signed)
Pt seen last week. Reports IV started left forearm. Area has been bruised but  formed a knot last night with mild redness.

## 2022-08-26 ENCOUNTER — Other Ambulatory Visit: Payer: Self-pay | Admitting: Physician Assistant

## 2022-08-26 DIAGNOSIS — K769 Liver disease, unspecified: Secondary | ICD-10-CM

## 2022-08-26 DIAGNOSIS — Z8719 Personal history of other diseases of the digestive system: Secondary | ICD-10-CM | POA: Diagnosis not present

## 2022-08-26 DIAGNOSIS — K7689 Other specified diseases of liver: Secondary | ICD-10-CM | POA: Diagnosis not present

## 2022-08-26 DIAGNOSIS — K5900 Constipation, unspecified: Secondary | ICD-10-CM | POA: Diagnosis not present

## 2022-09-13 ENCOUNTER — Ambulatory Visit
Admission: RE | Admit: 2022-09-13 | Discharge: 2022-09-13 | Disposition: A | Discharge status: Home / Self Care | Payer: Medicare Other | Source: Ambulatory Visit | Attending: Physician Assistant | Admitting: Physician Assistant

## 2022-09-13 DIAGNOSIS — K769 Liver disease, unspecified: Secondary | ICD-10-CM

## 2022-09-13 DIAGNOSIS — K828 Other specified diseases of gallbladder: Secondary | ICD-10-CM | POA: Diagnosis not present

## 2022-09-13 MED ORDER — GADOPICLENOL 0.5 MMOL/ML IV SOLN
9.0000 mL | Freq: Once | INTRAVENOUS | Status: AC | PRN
  Administered 2022-09-13: 9 mL via INTRAVENOUS

## 2022-09-28 DIAGNOSIS — L905 Scar conditions and fibrosis of skin: Secondary | ICD-10-CM | POA: Diagnosis not present

## 2022-09-29 DIAGNOSIS — D225 Melanocytic nevi of trunk: Secondary | ICD-10-CM | POA: Diagnosis not present

## 2022-09-29 DIAGNOSIS — W908XXA Exposure to other nonionizing radiation, initial encounter: Secondary | ICD-10-CM | POA: Diagnosis not present

## 2022-09-29 DIAGNOSIS — C44719 Basal cell carcinoma of skin of left lower limb, including hip: Secondary | ICD-10-CM | POA: Diagnosis not present

## 2022-09-29 DIAGNOSIS — Z85828 Personal history of other malignant neoplasm of skin: Secondary | ICD-10-CM | POA: Diagnosis not present

## 2022-09-29 DIAGNOSIS — L578 Other skin changes due to chronic exposure to nonionizing radiation: Secondary | ICD-10-CM | POA: Diagnosis not present

## 2022-09-29 DIAGNOSIS — L570 Actinic keratosis: Secondary | ICD-10-CM | POA: Diagnosis not present

## 2022-10-07 DIAGNOSIS — M7138 Other bursal cyst, other site: Secondary | ICD-10-CM | POA: Diagnosis not present

## 2022-10-07 DIAGNOSIS — M47816 Spondylosis without myelopathy or radiculopathy, lumbar region: Secondary | ICD-10-CM | POA: Diagnosis not present

## 2022-10-19 DIAGNOSIS — L905 Scar conditions and fibrosis of skin: Secondary | ICD-10-CM | POA: Diagnosis not present

## 2022-10-26 DIAGNOSIS — E7800 Pure hypercholesterolemia, unspecified: Secondary | ICD-10-CM | POA: Diagnosis not present

## 2022-10-26 DIAGNOSIS — K219 Gastro-esophageal reflux disease without esophagitis: Secondary | ICD-10-CM | POA: Diagnosis not present

## 2022-10-26 DIAGNOSIS — K861 Other chronic pancreatitis: Secondary | ICD-10-CM | POA: Diagnosis not present

## 2022-10-26 DIAGNOSIS — I700 Atherosclerosis of aorta: Secondary | ICD-10-CM | POA: Diagnosis not present

## 2022-10-26 DIAGNOSIS — Z1331 Encounter for screening for depression: Secondary | ICD-10-CM | POA: Diagnosis not present

## 2022-10-26 DIAGNOSIS — I10 Essential (primary) hypertension: Secondary | ICD-10-CM | POA: Diagnosis not present

## 2022-10-26 DIAGNOSIS — Z0000 Encounter for general adult medical examination without abnormal findings: Secondary | ICD-10-CM | POA: Diagnosis not present

## 2022-10-26 DIAGNOSIS — M109 Gout, unspecified: Secondary | ICD-10-CM | POA: Diagnosis not present

## 2022-10-26 DIAGNOSIS — Z125 Encounter for screening for malignant neoplasm of prostate: Secondary | ICD-10-CM | POA: Diagnosis not present

## 2022-10-28 DIAGNOSIS — K080 Exfoliation of teeth due to systemic causes: Secondary | ICD-10-CM | POA: Diagnosis not present

## 2022-11-30 DIAGNOSIS — M25562 Pain in left knee: Secondary | ICD-10-CM | POA: Diagnosis not present

## 2022-11-30 DIAGNOSIS — M25551 Pain in right hip: Secondary | ICD-10-CM | POA: Diagnosis not present

## 2022-11-30 DIAGNOSIS — M25561 Pain in right knee: Secondary | ICD-10-CM | POA: Diagnosis not present

## 2022-12-25 DIAGNOSIS — R69 Illness, unspecified: Secondary | ICD-10-CM | POA: Diagnosis not present

## 2022-12-26 DIAGNOSIS — R69 Illness, unspecified: Secondary | ICD-10-CM | POA: Diagnosis not present

## 2022-12-27 DIAGNOSIS — R69 Illness, unspecified: Secondary | ICD-10-CM | POA: Diagnosis not present

## 2022-12-28 DIAGNOSIS — R69 Illness, unspecified: Secondary | ICD-10-CM | POA: Diagnosis not present

## 2022-12-29 DIAGNOSIS — R69 Illness, unspecified: Secondary | ICD-10-CM | POA: Diagnosis not present

## 2022-12-30 DIAGNOSIS — R69 Illness, unspecified: Secondary | ICD-10-CM | POA: Diagnosis not present

## 2022-12-31 DIAGNOSIS — R69 Illness, unspecified: Secondary | ICD-10-CM | POA: Diagnosis not present

## 2023-01-02 DIAGNOSIS — R69 Illness, unspecified: Secondary | ICD-10-CM | POA: Diagnosis not present

## 2023-01-03 DIAGNOSIS — R69 Illness, unspecified: Secondary | ICD-10-CM | POA: Diagnosis not present

## 2023-04-18 DIAGNOSIS — L820 Inflamed seborrheic keratosis: Secondary | ICD-10-CM | POA: Diagnosis not present

## 2023-04-18 DIAGNOSIS — Z85828 Personal history of other malignant neoplasm of skin: Secondary | ICD-10-CM | POA: Diagnosis not present

## 2023-04-18 DIAGNOSIS — D1801 Hemangioma of skin and subcutaneous tissue: Secondary | ICD-10-CM | POA: Diagnosis not present

## 2023-04-18 DIAGNOSIS — L570 Actinic keratosis: Secondary | ICD-10-CM | POA: Diagnosis not present

## 2023-04-18 DIAGNOSIS — L821 Other seborrheic keratosis: Secondary | ICD-10-CM | POA: Diagnosis not present

## 2023-04-18 DIAGNOSIS — D045 Carcinoma in situ of skin of trunk: Secondary | ICD-10-CM | POA: Diagnosis not present

## 2023-04-19 DIAGNOSIS — M25561 Pain in right knee: Secondary | ICD-10-CM | POA: Diagnosis not present

## 2023-05-03 DIAGNOSIS — K080 Exfoliation of teeth due to systemic causes: Secondary | ICD-10-CM | POA: Diagnosis not present

## 2023-06-08 DIAGNOSIS — K080 Exfoliation of teeth due to systemic causes: Secondary | ICD-10-CM | POA: Diagnosis not present

## 2023-06-29 DIAGNOSIS — M47816 Spondylosis without myelopathy or radiculopathy, lumbar region: Secondary | ICD-10-CM | POA: Diagnosis not present

## 2023-07-18 DIAGNOSIS — M47816 Spondylosis without myelopathy or radiculopathy, lumbar region: Secondary | ICD-10-CM | POA: Diagnosis not present

## 2023-08-16 DIAGNOSIS — M461 Sacroiliitis, not elsewhere classified: Secondary | ICD-10-CM | POA: Diagnosis not present

## 2023-08-23 DIAGNOSIS — H52222 Regular astigmatism, left eye: Secondary | ICD-10-CM | POA: Diagnosis not present

## 2023-08-30 ENCOUNTER — Encounter: Payer: Self-pay | Admitting: Family Medicine

## 2023-08-30 ENCOUNTER — Ambulatory Visit (INDEPENDENT_AMBULATORY_CARE_PROVIDER_SITE_OTHER): Payer: Medicare Other | Admitting: Family Medicine

## 2023-08-30 VITALS — BP 142/80 | HR 69 | Temp 98.2°F | Ht 71.0 in | Wt 200.8 lb

## 2023-08-30 DIAGNOSIS — M48061 Spinal stenosis, lumbar region without neurogenic claudication: Secondary | ICD-10-CM

## 2023-08-30 DIAGNOSIS — K760 Fatty (change of) liver, not elsewhere classified: Secondary | ICD-10-CM | POA: Insufficient documentation

## 2023-08-30 DIAGNOSIS — G8929 Other chronic pain: Secondary | ICD-10-CM

## 2023-08-30 DIAGNOSIS — M109 Gout, unspecified: Secondary | ICD-10-CM | POA: Diagnosis not present

## 2023-08-30 DIAGNOSIS — M545 Low back pain, unspecified: Secondary | ICD-10-CM

## 2023-08-30 DIAGNOSIS — N529 Male erectile dysfunction, unspecified: Secondary | ICD-10-CM | POA: Insufficient documentation

## 2023-08-30 DIAGNOSIS — Z8719 Personal history of other diseases of the digestive system: Secondary | ICD-10-CM | POA: Insufficient documentation

## 2023-08-30 DIAGNOSIS — I700 Atherosclerosis of aorta: Secondary | ICD-10-CM | POA: Insufficient documentation

## 2023-08-30 DIAGNOSIS — E785 Hyperlipidemia, unspecified: Secondary | ICD-10-CM | POA: Insufficient documentation

## 2023-08-30 DIAGNOSIS — K7689 Other specified diseases of liver: Secondary | ICD-10-CM | POA: Insufficient documentation

## 2023-08-30 MED ORDER — TADALAFIL 20 MG PO TABS: 20.0000 mg | ORAL_TABLET | Freq: Every day | ORAL | 11 refills | Status: DC | PRN

## 2023-08-30 MED ORDER — GABAPENTIN 600 MG PO TABS: 600.0000 mg | ORAL_TABLET | Freq: Four times a day (QID) | ORAL | 3 refills | Status: DC

## 2023-08-30 NOTE — Assessment & Plan Note (Signed)
Stable. Continue allopurinol 300 mg daily and colchicine 0.6 mg as needed for gout attacks.

## 2023-08-30 NOTE — Progress Notes (Signed)
[facility] [address] [place] [address] Dept: [phone] Dept Fax: [phone]  New Patient Office Visit  Subjective:    Patient ID: Corey Hawkins, male    DOB: 10/06/1955, 68 y.o..   MRN: [id number]  Chief Complaint  Patient presents with   Establish Care    [facility]- establish care.   Wants meds for ED,    History of Present Illness:  Patient is in today to establish care. Mr. Corey Hawkins was born in [place], [place]. At age 34, his family moved to [place], [place]. He attended [employer], majoring in psychology. 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 He has been married for 48 years. He has three children (42, 40, 38) and four grandchildren. He denies use of tobacco or drugs. He drinks alcohol about twice a week.  Mr. Corey Hawkins has a history of hypertension, but notes it had improved to where he no longer needs to be on medication. he does get some white coat hypertension at times. Home blood pressures are typically <120/70.  Mr. Corey Hawkins has a history of gout. He manages this with allopurinol daily and colchicine as needed.  Mr. Corey Hawkins has a history of chronic low back due to lumbar stenosis, spondylosis, and disc disease. He notes he is seen by a physical medicine practice that has been giving him periodic injections in his lower back. he was advised to have surgery, but has been delaying this. He is managed on ibuprofen 200 mg up to 8 tabs daily, gabapentin 300 mg 2 tabs four times a day, and cyclobenzaprine 10 mg at bedtime. He has received some tramadol, but rarely takes this.  Mr. Corey Hawkins has a history of erectile dysfunction. He has been using sildenafil-tadalafil-apomorphine (Rugiet) 150-22-3 mg as needed. He gets this through the  internet. He would like to try switching to tadalafil (Cialis) 20 mg daily.  Past Medical History: Patient Active Problem List   Diagnosis Date Noted   Hardening of the aorta (main artery of the heart) (HCC) 08/30/2023   History of pancreatitis 08/30/2023   Hyperlipidemia 08/30/2023   Fatty liver 08/30/2023   Focal nodular hyperplasia of liver 08/30/2023   Erectile dysfunction 08/30/2023   Restless legs 07/15/2022   Extensor tendon laceration, hand, open wound, left, initial encounter 01/14/2022   Degenerative lumbar spinal stenosis 03/21/2020   Chronic low back pain 03/21/2020   Lumbar spondylosis 03/21/2020   Hepatic lesion 08/14/2015   Essential hypertension 08/14/2015   GERD (gastroesophageal reflux disease) 08/14/2015   Gout 08/14/2015   Past Surgical History:  Procedure Laterality Date   BUNIONECTOMY Left    EXCISION MORTON'S NEUROMA Left    KNEE SURGERY     ACL repair and meniscectomy with revisions. Four surgeries total.   POSTERIOR FUSION CERVICAL SPINE     C3-4   Family History  Problem Relation Age of Onset   Cancer Mother        Lung (non-smoker)   Gallbladder disease Mother    Heart disease Father    Gallbladder disease Father    Cancer Sister        Cervical   Gallbladder disease Brother    Cancer Maternal Uncle    Heart disease Maternal Grandmother    Diabetes Maternal Grandmother    Heart disease Maternal Grandfather  Heart disease Paternal Grandfather    Outpatient Medications Prior to Visit  Medication Sig Dispense Refill   allopurinol (ZYLOPRIM) 300 MG tablet Take 300 mg by mouth daily.  0   Cholecalciferol 10 MCG (400 UNIT) CAPS      colchicine 0.6 MG tablet 1 tablet     cyclobenzaprine (FLEXERIL) 10 MG tablet Take 10 mg by mouth at bedtime as needed.     diazepam (VALIUM) 5 MG tablet Take 1 tablet (5 mg total) by mouth every 8 (eight) hours as needed for muscle spasms. 15 tablet 0   magnesium chloride (SLOW-MAG) 64 MG TBEC SR tablet Take by  mouth.     omeprazole (PRILOSEC) 20 MG capsule Take 20 mg by mouth daily.     Potassium 99 MG TABS 1 tablet Orally Once a day     traMADol (ULTRAM) 50 MG tablet 1-2 tablets every 6 hours as needed for pain 20 tablet 0   gabapentin (NEURONTIN) 300 MG capsule Take 300 mg by mouth 4 (four) times daily.     co-enzyme Q-10 30 MG capsule Take 30 mg by mouth daily.      HYDROmorphone (DILAUDID) 2 MG tablet Take 1 tablet (2 mg total) by mouth every 4 (four) hours as needed for severe pain. 15 tablet 0   lisinopril (PRINIVIL,ZESTRIL) 2.5 MG tablet Take 2.5 mg by mouth daily.     Omega-3 Fatty Acids (FISH OIL PO) Take 1 tablet by mouth daily.      ondansetron (ZOFRAN ODT) 4 MG disintegrating tablet Take 1 tablet (4 mg total) by mouth every 4 (four) hours as needed for nausea or vomiting. 20 tablet 0   No facility-administered medications prior to visit.   Allergies  Allergen Reactions   Vibramycin [Doxycycline Hyclate] Other (See Comments)    All "mycins"; cause mouth sores   Penicillins Rash    "all 'cillins" Has patient had a PCN reaction causing immediate rash, facial/tongue/throat swelling, SOB or lightheadedness with hypotension: Yes Has patient had a PCN reaction causing severe rash involving mucus membranes or skin necrosis: No Has patient had a PCN reaction that required hospitalization No Has patient had a PCN reaction occurring within the last 10 years: No If all of the above answers are "NO", then may proceed with Cephalosporin use.    Objective:   Today's Vitals   08/30/23 1337  BP: (!) 142/80  Pulse: 69  Temp: 98.2 F (36.8 C)  TempSrc: Temporal  SpO2: 99%  Weight: 200 lb 12.8 oz (91.1 kg)  Height: 5\' 11"  (1.803 m)   Body mass index is 28.01 kg/m.   General: Well developed, well nourished. No acute distress. Psych: Alert and oriented. Normal mood and affect.  Health Maintenance Due  Topic Date Due   Hepatitis C Screening  Never done   Zoster Vaccines- Shingrix (1 of  2) 11/12/2004   Pneumonia Vaccine 56+ Years old (1 of 1 - PCV) Never done   Medicare Annual Wellness (AWV)  10/26/2023     Assessment & Plan:   Problem List Items Addressed This Visit       Other   Chronic low back pain - Primary   After discussing his current therapy, I did warn him about risk associated with using ibuprofen in higher dose over a long period of time. I will continue his gabapentin, but switch him to 600 mg tabs, so he does not have to keep up with such a large quantity.  Relevant Medications   cyclobenzaprine (FLEXERIL) 10 MG tablet   gabapentin (NEURONTIN) 600 MG tablet   Degenerative lumbar spinal stenosis   As Mr. Pelcher has had progressively worsening low back pain. I will request a repeat MRI scan to determine if he may be approaching the need for surgery.      Relevant Orders   MR Lumbar Spine Wo Contrast   Erectile dysfunction   I will prescribe tadalafil (Cialis) 20 mg daily and see if this is effective for him compared to the online pharmacy product he was using.      Relevant Medications   tadalafil (CIALIS) 20 MG tablet   Gout   Stable. Continue allopurinol 300 mg daily and colchicine 0.6 mg as needed for gout attacks.       Return in about 2 months (around 10/28/2023) for Annual preventative care.   Loyola Mast, MD

## 2023-08-30 NOTE — Assessment & Plan Note (Signed)
After discussing his current therapy, I did warn him about risk associated with using ibuprofen in higher dose over a long period of time. I will continue his gabapentin, but switch him to 600 mg tabs, so he does not have to keep up with such a large quantity.

## 2023-08-30 NOTE — Assessment & Plan Note (Signed)
I will prescribe tadalafil (Cialis) 20 mg daily and see if this is effective for him compared to the online pharmacy product he was using.

## 2023-08-30 NOTE — Assessment & Plan Note (Signed)
As Corey Hawkins has had progressively worsening low back pain. I will request a repeat MRI scan to determine if he may be approaching the need for surgery.

## 2023-09-01 ENCOUNTER — Other Ambulatory Visit: Payer: Self-pay | Admitting: Family Medicine

## 2023-09-01 DIAGNOSIS — G8929 Other chronic pain: Secondary | ICD-10-CM

## 2023-09-01 NOTE — Telephone Encounter (Signed)
Copied from CRM [id number]. Topic: Clinical - Medication Refill >> Sep 01, 2023 10:06 AM Pascal Lux wrote: Most Recent Primary Care Visit:  Provider: Loyola Mast  Department: [facility]  Visit Type: NEW PT - OFFICE VISIT  Date: 08/30/2023  Medication: gabapentin (NEURONTIN) 600 MG tablet [[id number]] allopurinol (ZYLOPRIM) 300 MG tablet [[id number]]  Has the patient contacted their pharmacy? Yes (Agent: If no, request that the patient contact the pharmacy for the refill. If patient does not wish to contact the pharmacy document the reason why and proceed with request.) (Agent: If yes, when and what did the pharmacy advise? - Patient contacted the pharmacy and they said to send his mail in order prescriptions to them with new provider.  Is this the correct pharmacy for this prescription? Yes If no, delete pharmacy and type the correct one.  This is the patient's preferred pharmacy:  [facility] Fax: [phone]   Has the prescription been filled recently? Yes  Is the patient out of the medication? No  Has the patient been seen for an appointment in the last year OR does the patient have an upcoming appointment? Yes  Can we respond through MyChart? Yes  Agent: Please be advised that Rx refills may take up to 3 business days. We ask that you follow-up with your pharmacy.

## 2023-09-05 ENCOUNTER — Telehealth: Payer: Self-pay

## 2023-09-05 DIAGNOSIS — N529 Male erectile dysfunction, unspecified: Secondary | ICD-10-CM

## 2023-09-05 NOTE — Telephone Encounter (Unsigned)
Copied from CRM [id number]. Topic: Clinical - Medication Question >> Sep 05, 2023 12:19 PM Taleah C wrote: Reason for CRM: pt called stated that he is confused with the instructions for his medication for tadalafil. He explained that Dr. Veto Kemps instructed for him to take 1 tablet daily and he would get 11 refills, every month. However, he only received a quantity of 10. He stated that the pharmacist assumed this might have been an error. Please call and advise.

## 2023-09-06 DIAGNOSIS — G8929 Other chronic pain: Secondary | ICD-10-CM

## 2023-09-06 MED ORDER — TADALAFIL 20 MG PO TABS: 20.0000 mg | ORAL_TABLET | Freq: Every day | ORAL | 11 refills | Status: AC

## 2023-09-12 MED ORDER — GABAPENTIN 600 MG PO TABS: 600.0000 mg | ORAL_TABLET | Freq: Four times a day (QID) | ORAL | 3 refills | Status: DC

## 2023-09-12 MED ORDER — ALLOPURINOL 300 MG PO TABS: 300.0000 mg | ORAL_TABLET | Freq: Every day | ORAL | 3 refills | Status: DC

## 2023-09-12 NOTE — Telephone Encounter (Signed)
Please review and advise. Thanks. Dm/cma  

## 2023-09-12 NOTE — Telephone Encounter (Signed)
Pt is wanting two scripts sent to [facility] fax #[phone]. He is wanting  gabapentin (NEURONTIN) 600 MG tablet [[id number]]  allopurinol (ZYLOPRIM) 300 MG tablet [[id number]] refill and sent over.  Pt came into office.

## 2023-09-19 ENCOUNTER — Ambulatory Visit
Admission: RE | Admit: 2023-09-19 | Discharge: 2023-09-19 | Disposition: A | Discharge status: Home / Self Care | Payer: Medicare Other | Source: Ambulatory Visit | Attending: Family Medicine | Admitting: Family Medicine

## 2023-09-19 DIAGNOSIS — M5126 Other intervertebral disc displacement, lumbar region: Secondary | ICD-10-CM | POA: Diagnosis not present

## 2023-09-19 DIAGNOSIS — M48061 Spinal stenosis, lumbar region without neurogenic claudication: Secondary | ICD-10-CM

## 2023-09-19 DIAGNOSIS — M47816 Spondylosis without myelopathy or radiculopathy, lumbar region: Secondary | ICD-10-CM | POA: Diagnosis not present

## 2023-09-22 DIAGNOSIS — M25561 Pain in right knee: Secondary | ICD-10-CM | POA: Diagnosis not present

## 2023-09-22 DIAGNOSIS — M25562 Pain in left knee: Secondary | ICD-10-CM | POA: Diagnosis not present

## 2023-09-23 ENCOUNTER — Telehealth: Payer: Self-pay

## 2023-09-23 NOTE — Telephone Encounter (Signed)
Copied from CRM [id number]. Topic: Clinical - Lab/Test Results >> Sep 23, 2023  2:05 PM Corey Hawkins wrote: Reason for CRM: Patient is requesting his MRI results of his lumbar spine through his MyChart when they're available.  Patient notified VIA my chart message that we don't have results as of yet. Dm/cma

## 2023-09-26 DIAGNOSIS — M25561 Pain in right knee: Secondary | ICD-10-CM | POA: Diagnosis not present

## 2023-10-03 ENCOUNTER — Encounter: Payer: Self-pay | Admitting: Family Medicine

## 2023-10-04 IMAGING — MR MR [PERSON_NAME]*[PERSON_NAME]* WO/W CM
9 series · 40 of 40 positions shown · IV contrast (multihance)
Comparison: None Available.

CLINICAL DATA: Left hand tendon issues for 2 months, swelling and
weakness

EXAM:
MRI OF THE LEFT HAND WITHOUT AND WITH CONTRAST
TECHNIQUE: Multiplanar, multisequence MR imaging of the left hand was performed
before and after the administration of intravenous contrast.
CONTRAST:  18mL MULTIHANCE GADOBENATE DIMEGLUMINE 529 MG/ML IV SOLN

[Series 6: T2 fat-sat · axial · right · 3.0mm · 0.55mm/px · z∈[-35,+151]mm · 7 of 62 slices shown]
[im 1/62]
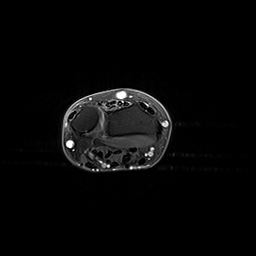
[im 11/62]
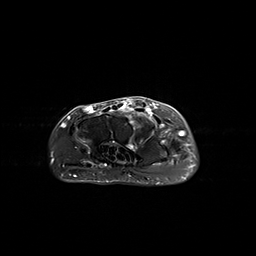
[im 21/62]
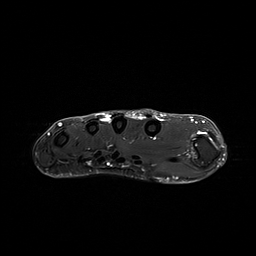
[im 31/62]
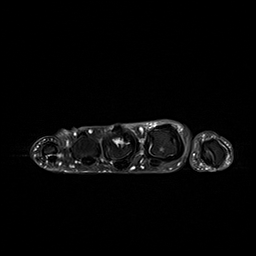
[im 41/62]
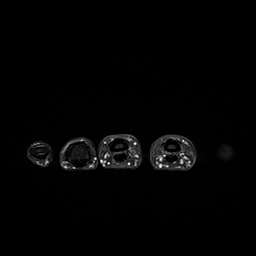
[im 51/62]
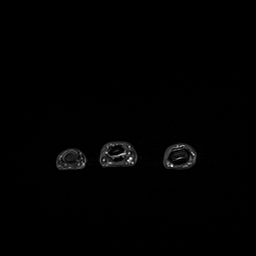
[im 62/62]
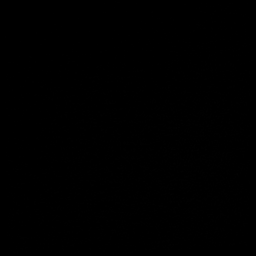

[Series 7: T1 · axial · right · 3.0mm · 0.55mm/px · z∈[-35,+151]mm · 7 of 62 slices shown (1 of 3)]
[im 1/62]
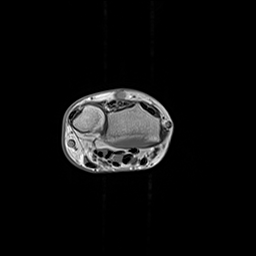
[im 11/62]
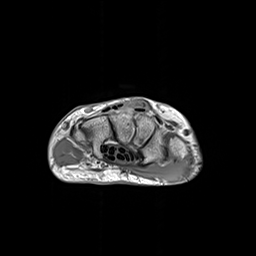
[im 21/62]
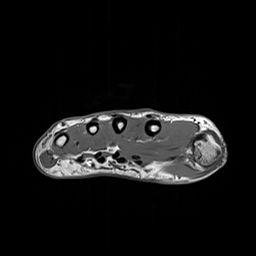
[im 31/62]
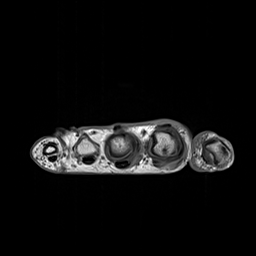
[im 41/62]
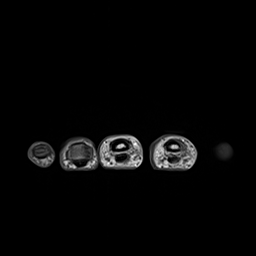
[im 51/62]
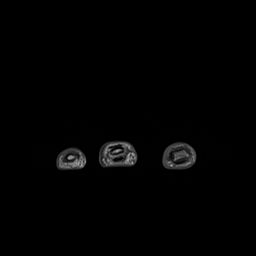
[im 62/62]
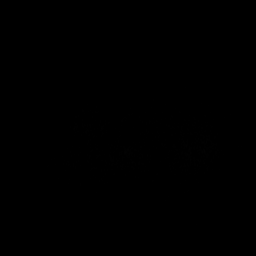

[Series 8: STIR · coronal · right · 3.0mm · 0.86mm/px · 2 of 21 slices shown]
[im 1/21]
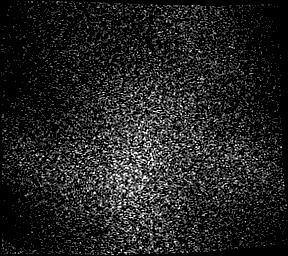
[im 21/21]
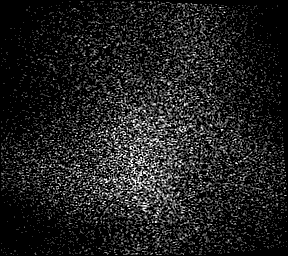

[Series 9: T1 · coronal · right · 3.0mm · 0.69mm/px · 2 of 21 slices shown (2 of 3)]
[im 1/21]
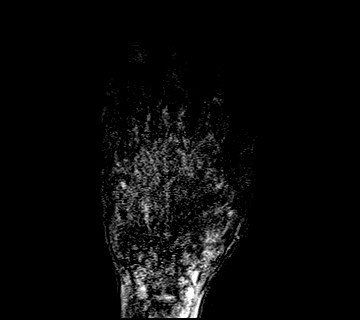
[im 21/21]
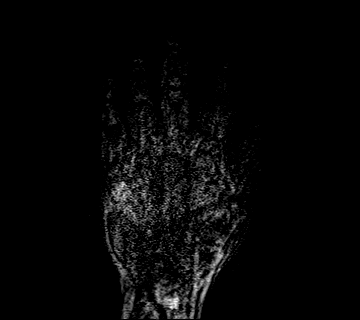

[Series 10: T1 · sagittal · right · 3.0mm · 0.69mm/px · 4 of 42 slices shown (3 of 3)]
[im 1/42]
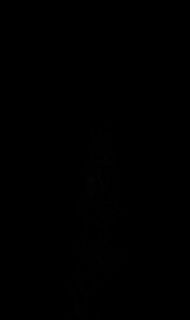
[im 14/42]
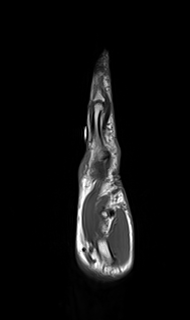
[im 28/42]
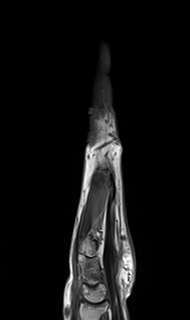
[im 42/42]
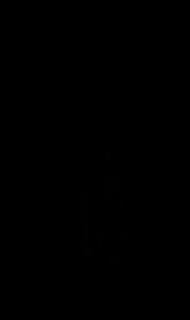

[Series 11: PD fat-sat · sagittal · right · 3.0mm · 0.69mm/px · 4 of 42 slices shown]
[im 1/42]
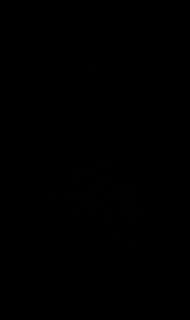
[im 14/42]
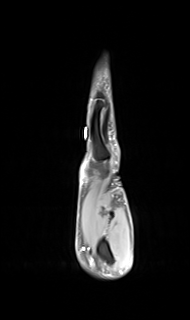
[im 28/42]
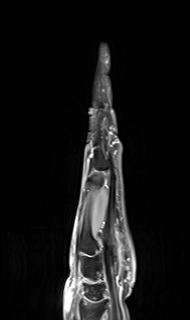
[im 42/42]
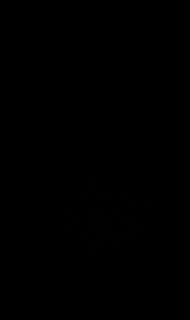

[Series 12: T1 fat-sat · axial · non-contrast · right · 3.0mm · 0.55mm/px · z∈[-35,+151]mm · 6 of 62 slices shown (1 of 2)]
[im 1/62]
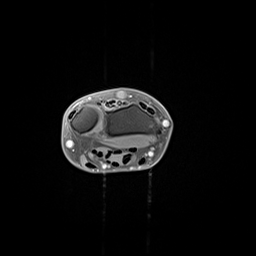
[im 13/62]
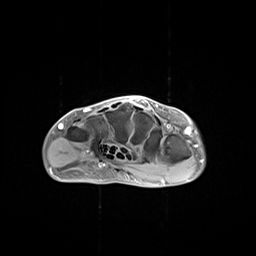
[im 25/62]
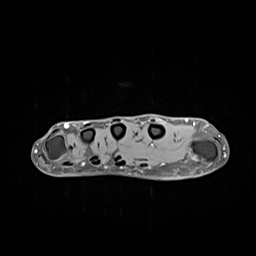
[im 37/62]
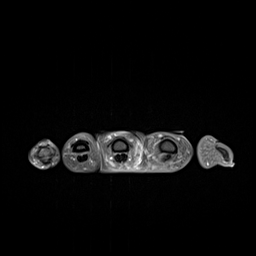
[im 49/62]
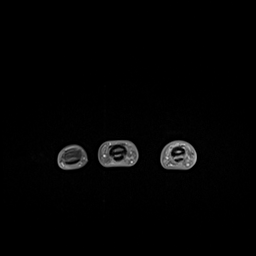
[im 62/62]
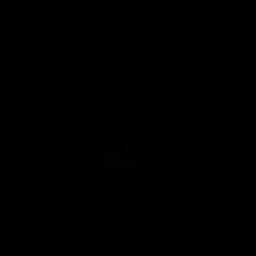

[Series 13: T1 fat-sat post-contrast · axial · right · 3.0mm · 0.55mm/px · z∈[-35,+151]mm · 6 of 62 slices shown]
[im 1/62]
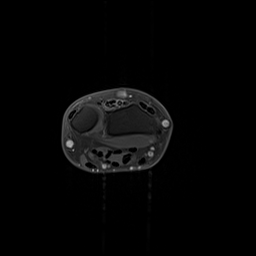
[im 13/62]
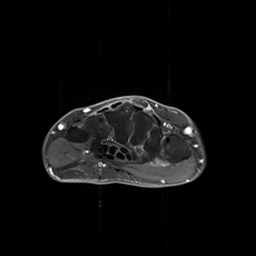
[im 25/62]
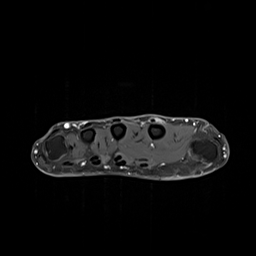
[im 37/62]
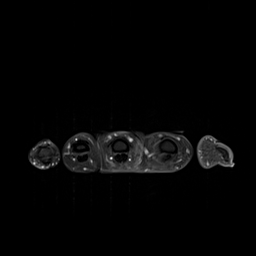
[im 49/62]
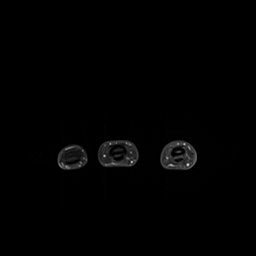
[im 62/62]
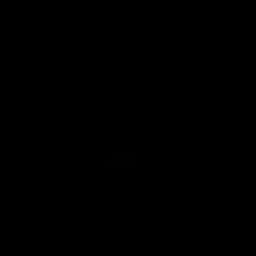

[Series 14: T1 fat-sat · coronal · right · 3.0mm · 0.86mm/px · 2 of 21 slices shown (2 of 2)]
[im 1/21]
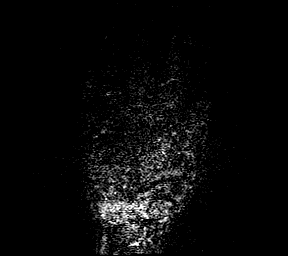
[im 21/21]
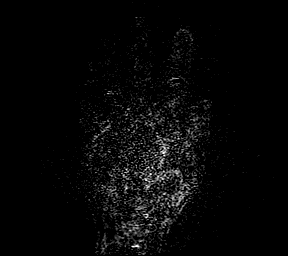

[40 of 40 positions shown; findings below may reference images not displayed]

FINDINGS: Bones/Joint/Cartilage

There is no evidence of fracture. Alignment is normal. There is mild
triscaphe and first carpometacarpal joint osteoarthritis. There is
mild intercarpal arthritis.

Ligaments

Intact collateral ligaments.  No evidence of pulley injury.

Muscles and Tendons

Torn extensor indices tendon at the level of the carpus (axial T2
images 49-56). Distal tendon stump is seen at the carpometacarpal
joint (series 6, image 49), proximal stump at the level of Lister's
tubercle at the wrist (series 6, image 59). There is adjacent soft
tissue swelling and carpal bossing at the base of the index finger.
The extensor digitorum communis tendon is intact through its distal
attachments. The flexor tendons are unremarkable.

Soft tissues

There is dorsal soft tissue swelling along the wrist. Additional
mild focal soft tissue swelling along the dorsal radial aspect of
the index finger at the level of the distal first metacarpal. There
is no focal fluid collection. No evidence of cystic or solid mass.
IMPRESSION: Torn extensor indices tendon at the level of the carpus. The distal
stump is at the carpometacarpal joint and the proximal stump is at
the level of Lister's tubercle. Adjacent soft tissue swelling.
Carpal bossing noted at the base of the index finger. Intact
extensor digitorum communis tendon of the index finger. Additional
focal soft tissue swelling along the dorsal radial aspect of the
index finger at the level of the distal metacarpal.

## 2023-10-11 DIAGNOSIS — M25561 Pain in right knee: Secondary | ICD-10-CM | POA: Diagnosis not present

## 2023-10-17 DIAGNOSIS — L821 Other seborrheic keratosis: Secondary | ICD-10-CM | POA: Diagnosis not present

## 2023-10-17 DIAGNOSIS — D225 Melanocytic nevi of trunk: Secondary | ICD-10-CM | POA: Diagnosis not present

## 2023-10-17 DIAGNOSIS — Z86008 Personal history of in-situ neoplasm of other site: Secondary | ICD-10-CM | POA: Diagnosis not present

## 2023-10-17 DIAGNOSIS — D692 Other nonthrombocytopenic purpura: Secondary | ICD-10-CM | POA: Diagnosis not present

## 2023-10-17 DIAGNOSIS — C4441 Basal cell carcinoma of skin of scalp and neck: Secondary | ICD-10-CM | POA: Diagnosis not present

## 2023-10-20 ENCOUNTER — Encounter: Payer: Self-pay | Admitting: Family Medicine

## 2023-10-25 ENCOUNTER — Other Ambulatory Visit: Payer: Self-pay | Admitting: Neurosurgery

## 2023-10-25 DIAGNOSIS — M48061 Spinal stenosis, lumbar region without neurogenic claudication: Secondary | ICD-10-CM | POA: Diagnosis not present

## 2023-10-25 DIAGNOSIS — M5127 Other intervertebral disc displacement, lumbosacral region: Secondary | ICD-10-CM | POA: Diagnosis not present

## 2023-10-25 DIAGNOSIS — M5417 Radiculopathy, lumbosacral region: Secondary | ICD-10-CM | POA: Diagnosis not present

## 2023-10-27 DIAGNOSIS — M5417 Radiculopathy, lumbosacral region: Secondary | ICD-10-CM | POA: Diagnosis not present

## 2023-11-02 DIAGNOSIS — K080 Exfoliation of teeth due to systemic causes: Secondary | ICD-10-CM | POA: Diagnosis not present

## 2023-11-08 ENCOUNTER — Encounter: Payer: Self-pay | Admitting: Family Medicine

## 2023-11-08 ENCOUNTER — Ambulatory Visit (INDEPENDENT_AMBULATORY_CARE_PROVIDER_SITE_OTHER): Payer: Medicare Other | Admitting: Family Medicine

## 2023-11-08 VITALS — BP 136/70 | HR 76 | Temp 97.6°F | Ht 71.0 in | Wt 196.0 lb

## 2023-11-08 DIAGNOSIS — I10 Essential (primary) hypertension: Secondary | ICD-10-CM

## 2023-11-08 DIAGNOSIS — M179 Osteoarthritis of knee, unspecified: Secondary | ICD-10-CM | POA: Insufficient documentation

## 2023-11-08 DIAGNOSIS — M75101 Unspecified rotator cuff tear or rupture of right shoulder, not specified as traumatic: Secondary | ICD-10-CM | POA: Insufficient documentation

## 2023-11-08 DIAGNOSIS — Z85820 Personal history of malignant melanoma of skin: Secondary | ICD-10-CM | POA: Insufficient documentation

## 2023-11-08 DIAGNOSIS — Z125 Encounter for screening for malignant neoplasm of prostate: Secondary | ICD-10-CM

## 2023-11-08 DIAGNOSIS — E785 Hyperlipidemia, unspecified: Secondary | ICD-10-CM

## 2023-11-08 DIAGNOSIS — M5417 Radiculopathy, lumbosacral region: Secondary | ICD-10-CM | POA: Insufficient documentation

## 2023-11-08 DIAGNOSIS — G500 Trigeminal neuralgia: Secondary | ICD-10-CM | POA: Insufficient documentation

## 2023-11-08 DIAGNOSIS — Z0000 Encounter for general adult medical examination without abnormal findings: Secondary | ICD-10-CM

## 2023-11-08 DIAGNOSIS — G8929 Other chronic pain: Secondary | ICD-10-CM

## 2023-11-08 DIAGNOSIS — K7689 Other specified diseases of liver: Secondary | ICD-10-CM | POA: Diagnosis not present

## 2023-11-08 DIAGNOSIS — M5127 Other intervertebral disc displacement, lumbosacral region: Secondary | ICD-10-CM | POA: Insufficient documentation

## 2023-11-08 DIAGNOSIS — M545 Low back pain, unspecified: Secondary | ICD-10-CM

## 2023-11-08 DIAGNOSIS — Z8669 Personal history of other diseases of the nervous system and sense organs: Secondary | ICD-10-CM | POA: Insufficient documentation

## 2023-11-08 LAB — COMPREHENSIVE METABOLIC PANEL WITH GFR
ALT: 20 U/L (ref 0–53)
AST: 19 U/L (ref 0–37)
Albumin: 4.4 g/dL (ref 3.5–5.2)
Alkaline Phosphatase: 79 U/L (ref 39–117)
BUN: 22 mg/dL (ref 6–23)
CO2: 29 meq/L (ref 19–32)
Calcium: 9.6 mg/dL (ref 8.4–10.5)
Chloride: 102 meq/L (ref 96–112)
Creatinine, Ser: 0.93 mg/dL (ref 0.40–1.50)
GFR: 84.09 mL/min (ref >60.00)
Glucose, Bld: 100 mg/dL — ABNORMAL HIGH (ref 70–99)
Potassium: 4.5 meq/L (ref 3.5–5.1)
Sodium: 141 meq/L (ref 135–145)
Total Bilirubin: 1.1 mg/dL (ref 0.2–1.2)
Total Protein: 6.7 g/dL (ref 6.0–8.3)

## 2023-11-08 LAB — LIPID PANEL
Cholesterol: 196 mg/dL (ref 0–200)
HDL: 66.9 mg/dL (ref >39.00)
LDL Cholesterol: 114 mg/dL — ABNORMAL HIGH (ref 0–99)
NonHDL: 129.07
Total CHOL/HDL Ratio: 3
Triglycerides: 75 mg/dL (ref 0.0–149.0)
VLDL: 15 mg/dL (ref 0.0–40.0)

## 2023-11-08 LAB — PSA, MEDICARE: PSA: 1.47 ng/mL (ref 0.10–4.00)

## 2023-11-08 NOTE — Progress Notes (Signed)
 [facility] [address] [place] [address] Dept: [phone] Dept Fax: [phone]  Annual Physical Visit  Subjective:    Patient ID: Corey Hawkins, male    DOB: [birth year], 68 y.o..   MRN: [id number]  Chief Complaint  Patient presents with   Annual Exam    CPE/labs.  Fasting today.      History of Present Illness:  Patient is in today for an annual physical/preventative visit.  Corey Hawkins has a history of hypertension. Currently he has not needed medication. He has had an occasional surge in his BP related to his back pain issues.   Corey Hawkins has a history of chronic low back due to lumbar stenosis, spondylosis, and disc disease.He received a recent ESI, which has his pain doing better. A few weeks ago, he was having significantly more pain. He is now planning to move ahead with an L3-L4 laminectomy and L5-S1 discectomy. He is managed on ibuprofen 200 mg up to 8 tabs daily, gabapentin 300 mg 2 tabs three times a day, and cyclobenzaprine 10 mg at bedtime.   Review of Systems  Constitutional:  Negative for chills, diaphoresis, fever, malaise/fatigue and weight loss.  HENT:  Negative for congestion, ear pain, hearing loss, sinus pain, sore throat and tinnitus.   Eyes:  Negative for blurred vision, pain, discharge and redness.  Respiratory:  Negative for cough, shortness of breath and wheezing.   Cardiovascular:  Negative for chest pain and palpitations.  Gastrointestinal:  Positive for heartburn. Negative for abdominal pain, constipation, diarrhea, nausea and vomiting.       Heartburn well managed on omeprazole.  Musculoskeletal:  Positive for back pain and joint pain. Negative for myalgias.       As above regarding back pain. Corey Hawkins has chronic left knee pain which fluctuates. he has had multiple prior ACL surgeries. He is trying to avoid going to surgery anytime soon.  Skin:  Negative for itching and rash.   Psychiatric/Behavioral:  Negative for depression. The patient is not nervous/anxious.     Past Medical History: Patient Active Problem List   Diagnosis Date Noted   Herniated nucleus pulposus of lumbosacral region 11/08/2023   Lumbosacral radiculopathy 11/08/2023   Knee osteoarthritis 11/08/2023   Right rotator cuff tear 11/08/2023   History of melanoma 11/08/2023   History of detached retina repair 11/08/2023   Trigeminal neuralgia of left side of face 11/08/2023   Aortic atherosclerosis (HCC) 08/30/2023   History of pancreatitis 08/30/2023   Borderline hyperlipidemia 08/30/2023   Fatty liver 08/30/2023   Focal nodular hyperplasia of liver 08/30/2023   Erectile dysfunction 08/30/2023   Spinal stenosis of lumbar region 03/21/2020   Chronic low back pain 03/21/2020   Lumbar spondylosis 03/21/2020   Essential hypertension 08/14/2015   GERD (gastroesophageal reflux disease) 08/14/2015   Gout 08/14/2015   Past Surgical History:  Procedure Laterality Date   BUNIONECTOMY Left    EXCISION MORTON'S NEUROMA Left    KNEE SURGERY     ACL repair and meniscectomy with revisions. Four surgeries total.   POSTERIOR FUSION CERVICAL SPINE     C3-4   SPINE SURGERY  2010   Cervical fusion   Family History  Problem Relation Age of Onset   Cancer Mother        Lung (non-smoker)   Gallbladder disease Mother    Heart disease Father    Gallbladder disease Father    Cancer Sister  Cervical   Gallbladder disease Brother    Cancer Maternal Uncle    Heart disease Maternal Grandmother    Diabetes Maternal Grandmother    Heart disease Maternal Grandfather    Heart disease Paternal Grandfather    Cancer Sister    Outpatient Medications Prior to Visit  Medication Sig Dispense Refill   allopurinol (ZYLOPRIM) 300 MG tablet Take 1 tablet (300 mg total) by mouth daily. 90 tablet 3   Ascorbic Acid (VITAMIN C) 1000 MG tablet Take 1,000 mg by mouth daily.     b complex vitamins capsule Take  1 capsule by mouth daily.     Cholecalciferol 10 MCG (400 UNIT) CAPS      colchicine 0.6 MG tablet 1 tablet     cyclobenzaprine (FLEXERIL) 10 MG tablet Take 10 mg by mouth at bedtime as needed.     diazepam (VALIUM) 5 MG tablet Take 1 tablet (5 mg total) by mouth every 8 (eight) hours as needed for muscle spasms. 15 tablet 0   gabapentin (NEURONTIN) 600 MG tablet Take 1 tablet (600 mg total) by mouth in the morning, at noon, in the evening, and at bedtime. 360 tablet 3   omeprazole (PRILOSEC) 20 MG capsule Take 20 mg by mouth daily.     Potassium 99 MG TABS 1 tablet Orally Once a day     tadalafil (CIALIS) 20 MG tablet Take 1 tablet (20 mg total) by mouth daily. 30 tablet 11   traMADol (ULTRAM) 50 MG tablet 1-2 tablets every 6 hours as needed for pain 20 tablet 0   magnesium chloride (SLOW-MAG) 64 MG TBEC SR tablet Take by mouth.     No facility-administered medications prior to visit.   Allergies  Allergen Reactions   Vibramycin [Doxycycline Hyclate] Other (See Comments)    All "mycins"; cause mouth sores   Penicillins Rash    "all 'cillins" Has patient had a PCN reaction causing immediate rash, facial/tongue/throat swelling, SOB or lightheadedness with hypotension: Yes Has patient had a PCN reaction causing severe rash involving mucus membranes or skin necrosis: No Has patient had a PCN reaction that required hospitalization No Has patient had a PCN reaction occurring within the last 10 years: No If all of the above answers are "NO", then may proceed with Cephalosporin use.    Objective:   Today's Vitals   11/08/23 0900  BP: 136/70  Pulse: 76  Temp: 97.6 F (36.4 C)  TempSrc: Temporal  SpO2: 98%  Weight: 196 lb (88.9 kg)  Height: 5\' 11"  (1.803 m)   Body mass index is 27.34 kg/m.   General: Well developed, well nourished. No acute distress. HEENT: Normocephalic, non-traumatic. PERRL, EOMI. Conjunctiva clear. External ears normal. EAC and   TMs normal bilaterally. Nose  clear without congestion or rhinorrhea. Mucous membranes moist.   Oropharynx clear. Good dentition. Neck: Supple. No lymphadenopathy. No thyromegaly. Lungs: Clear to auscultation bilaterally. No wheezing, rales or rhonchi. CV: RRR without murmurs or rubs. Pulses 2+ bilaterally. Abdomen: Soft, non-tender. Bowel sounds positive, normal pitch and frequency. No hepatosplenomegaly.   No rebound or guarding. Extremities: Full ROM. Some bony hypertrophy of the left knee. No tenderness. No edema noted. Skin: Warm and dry. No rashes. Psych: Alert and oriented. Normal mood and affect.  Health Maintenance Due  Topic Date Due   Hepatitis C Screening  Never done   Zoster Vaccines- Shingrix (1 of 2) Never done   Pneumonia Vaccine 52+ Years old (1 of 1 - PCV) Never done  Medicare Annual Wellness (AWV)  10/26/2023     Assessment & Plan:   Problem List Items Addressed This Visit       Cardiovascular and Mediastinum   Essential hypertension   Blood pressure is only mildly high today. His pain issues have been a driver for this. We will continue to monitor this for now. I will check annual renal labs.      Relevant Orders   Comprehensive metabolic panel with GFR (Completed)     Digestive   Focal nodular hyperplasia of liver   I will reassess liver enzymes today.      Relevant Orders   Comprehensive metabolic panel with GFR (Completed)     Other   Borderline hyperlipidemia   I will reassess lipids today.      Relevant Orders   Lipid panel (Completed)   Chronic low back pain   Plans to move ahead with lower back surgery.      Other Visit Diagnoses       Annual physical exam    -  Primary   Overall fair health. We discussed recommended screenigns and immunizations. Patient refuses any immunizations at present.     Screening for prostate cancer       Relevant Orders   PSA, Medicare (Completed)       Return in about 6 months (around 05/09/2024) for Reassessment.   Loyola Mast, MD

## 2023-11-08 NOTE — Assessment & Plan Note (Signed)
 I will reassess lipids today.

## 2023-11-08 NOTE — Assessment & Plan Note (Signed)
 I will reassess liver enzymes today.

## 2023-11-08 NOTE — Assessment & Plan Note (Signed)
 Blood pressure is only mildly high today. His pain issues have been a driver for this. We will continue to monitor this for now. I will check annual renal labs.

## 2023-11-08 NOTE — Assessment & Plan Note (Signed)
 Plans to move ahead with lower back surgery.

## 2023-11-10 NOTE — Progress Notes (Signed)
 Surgical Instructions   Your procedure is scheduled on November 24, 2023. Report to [facility] Main Entrance "A" at 12:30 P.M., then check in with the Admitting office. Any questions or running late day of surgery: call [phone]  Questions prior to your surgery date: call [phone], Monday-Friday, 8am-4pm. If you experience any cold or flu symptoms such as cough, fever, chills, shortness of breath, etc. between now and your scheduled surgery, please notify us at the above number.     Remember:  Do not eat  or drink after midnight the night before your surgery      Take these medicines the morning of surgery with A SIP OF WATER  allopurinol (ZYLOPRIM)  gabapentin (NEURONTIN)  omeprazole (PRILOSEC)    May take these medicines IF NEEDED: colchicine  cyclobenzaprine (FLEXERIL)  diazepam (VALIUM)  traMADol (ULTRAM)     One week prior to surgery, STOP taking any Aspirin (unless otherwise instructed by your surgeon) Aleve, Naproxen, Ibuprofen, Motrin, Advil, Goody's, BC's, all herbal medications, fish oil, and non-prescription vitamins.                     Do NOT Smoke (Tobacco/Vaping) for 24 hours prior to your procedure.  If you use a CPAP at night, you may bring your mask/headgear for your overnight stay.   You will be asked to remove any contacts, glasses, piercing's, hearing aid's, dentures/partials prior to surgery. Please bring cases for these items if needed.    Patients discharged the day of surgery will not be allowed to drive home, and someone needs to stay with them for 24 hours.  SURGICAL WAITING ROOM VISITATION Patients may have no more than 2 support people in the waiting area - these visitors may rotate.   Pre-op nurse will coordinate an appropriate time for 1 ADULT support person, who may not rotate, to accompany patient in pre-op.  Children under the age of 29 must have an adult with them who is not the patient and must remain in the main waiting area with  an adult.  If the patient needs to stay at the hospital during part of their recovery, the visitor guidelines for inpatient rooms apply.  Please refer to the [facility] website for the visitor guidelines for any additional information.   If you received a COVID test during your pre-op visit  it is requested that you wear a mask when out in public, stay away from anyone that may not be feeling well and notify your surgeon if you develop symptoms. If you have been in contact with anyone that has tested positive in the last 10 days please notify you surgeon.      Pre-operative 5 CHG Bathing Instructions   You can play a key role in reducing the risk of infection after surgery. Your skin needs to be as free of germs as possible. You can reduce the number of germs on your skin by washing with CHG (chlorhexidine gluconate) soap before surgery. CHG is an antiseptic soap that kills germs and continues to kill germs even after washing.   DO NOT use if you have an allergy to chlorhexidine/CHG or antibacterial soaps. If your skin becomes reddened or irritated, stop using the CHG and notify one of our RNs at [phone].   Please shower with the CHG soap starting 4 days before surgery using the following schedule:     Please keep in mind the following:  DO NOT shave, including legs and underarms, starting the day  of your first shower.   You may shave your face at any point before/day of surgery.  Place clean sheets on your bed the day you start using CHG soap. Use a clean washcloth (not used since being washed) for each shower. DO NOT sleep with pets once you start using the CHG.   CHG Shower Instructions:  Wash your face and private area with normal soap. If you choose to wash your hair, wash first with your normal shampoo.  After you use shampoo/soap, rinse your hair and body thoroughly to remove shampoo/soap residue.  Turn the water OFF and apply about 3 tablespoons (45 ml) of CHG soap to a  CLEAN washcloth.  Apply CHG soap ONLY FROM YOUR NECK DOWN TO YOUR TOES (washing for 3-5 minutes)  DO NOT use CHG soap on face, private areas, open wounds, or sores.  Pay special attention to the area where your surgery is being performed.  If you are having back surgery, having someone wash your back for you may be helpful. Wait 2 minutes after CHG soap is applied, then you may rinse off the CHG soap.  Pat dry with a clean towel  Put on clean clothes/pajamas   If you choose to wear lotion, please use ONLY the CHG-compatible lotions that are listed below.  Additional instructions for the day of surgery: DO NOT APPLY any lotions, deodorants, cologne, or perfumes.   Do not bring valuables to the hospital. [facility] is not responsible for any belongings/valuables. Do not wear nail polish, gel polish, artificial nails, or any other type of covering on natural nails (fingers and toes) Do not wear jewelry or makeup Put on clean/comfortable clothes.  Please brush your teeth.  Ask your nurse before applying any prescription medications to the skin.     CHG Compatible Lotions   Aveeno Moisturizing lotion  Cetaphil Moisturizing Cream  Cetaphil Moisturizing Lotion  Clairol Herbal Essence Moisturizing Lotion, Dry Skin  Clairol Herbal Essence Moisturizing Lotion, Extra Dry Skin  Clairol Herbal Essence Moisturizing Lotion, Normal Skin  Curel Age Defying Therapeutic Moisturizing Lotion with Alpha Hydroxy  Curel Extreme Care Body Lotion  Curel Soothing Hands Moisturizing Hand Lotion  Curel Therapeutic Moisturizing Cream, Fragrance-Free  Curel Therapeutic Moisturizing Lotion, Fragrance-Free  Curel Therapeutic Moisturizing Lotion, Original Formula  Eucerin Daily Replenishing Lotion  Eucerin Dry Skin Therapy Plus Alpha Hydroxy Crme  Eucerin Dry Skin Therapy Plus Alpha Hydroxy Lotion  Eucerin Original Crme  Eucerin Original Lotion  Eucerin Plus Crme Eucerin Plus Lotion  Eucerin TriLipid  Replenishing Lotion  Keri Anti-Bacterial Hand Lotion  Keri Deep Conditioning Original Lotion Dry Skin Formula Softly Scented  Keri Deep Conditioning Original Lotion, Fragrance Free Sensitive Skin Formula  Keri Lotion Fast Absorbing Fragrance Free Sensitive Skin Formula  Keri Lotion Fast Absorbing Softly Scented Dry Skin Formula  Keri Original Lotion  Keri Skin Renewal Lotion Keri Silky Smooth Lotion  Keri Silky Smooth Sensitive Skin Lotion  Nivea Body Creamy Conditioning Oil  Nivea Body Extra Enriched Lotion  Nivea Body Original Lotion  Nivea Body Sheer Moisturizing Lotion Nivea Crme  Nivea Skin Firming Lotion  NutraDerm 30 Skin Lotion  NutraDerm Skin Lotion  NutraDerm Therapeutic Skin Cream  NutraDerm Therapeutic Skin Lotion  ProShield Protective Hand Cream  Provon moisturizing lotion  Please read over the following fact sheets that you were given.

## 2023-11-11 ENCOUNTER — Encounter (HOSPITAL_COMMUNITY)
Admission: RE | Admit: 2023-11-11 | Discharge: 2023-11-11 | Disposition: A | Discharge status: Home / Self Care | Source: Ambulatory Visit | Attending: Neurosurgery | Admitting: Neurosurgery

## 2023-11-11 ENCOUNTER — Other Ambulatory Visit: Payer: Self-pay

## 2023-11-11 ENCOUNTER — Encounter (HOSPITAL_COMMUNITY): Payer: Self-pay

## 2023-11-11 VITALS — BP 145/92 | HR 69 | Temp 98.5°F | Resp 17 | Ht 71.0 in | Wt 199.8 lb

## 2023-11-11 DIAGNOSIS — M48061 Spinal stenosis, lumbar region without neurogenic claudication: Secondary | ICD-10-CM | POA: Diagnosis not present

## 2023-11-11 DIAGNOSIS — I10 Essential (primary) hypertension: Secondary | ICD-10-CM | POA: Insufficient documentation

## 2023-11-11 DIAGNOSIS — Z01818 Encounter for other preprocedural examination: Secondary | ICD-10-CM | POA: Insufficient documentation

## 2023-11-11 DIAGNOSIS — Z85828 Personal history of other malignant neoplasm of skin: Secondary | ICD-10-CM | POA: Diagnosis not present

## 2023-11-11 HISTORY — DX: Personal history of urinary calculi: Z87.442

## 2023-11-11 HISTORY — DX: Trigeminal neuralgia: G50.0

## 2023-11-11 HISTORY — DX: Headache, unspecified: R51.9

## 2023-11-11 HISTORY — DX: Other complications of anesthesia, initial encounter: T88.59XA

## 2023-11-11 LAB — CBC
HCT: 45.7 % (ref 39.0–52.0)
Hemoglobin: 15.2 g/dL (ref 13.0–17.0)
MCH: 31.1 pg (ref 26.0–34.0)
MCHC: 33.3 g/dL (ref 30.0–36.0)
MCV: 93.5 fL (ref 80.0–100.0)
Platelets: 249 10*3/uL (ref 150–400)
RBC: 4.89 MIL/uL (ref 4.22–5.81)
RDW: 13 % (ref 11.5–15.5)
WBC: 11.7 10*3/uL — ABNORMAL HIGH (ref 4.0–10.5)
nRBC: 0 % (ref 0.0–0.2)

## 2023-11-11 LAB — SURGICAL PCR SCREEN
MRSA, PCR: POSITIVE — AB
Staphylococcus aureus: POSITIVE — AB

## 2023-11-11 NOTE — Progress Notes (Signed)
 Lab called to to sts the pt's PCR results +MRSA/STAPH.

## 2023-11-11 NOTE — Progress Notes (Signed)
 PCP - Dr. Herbie Drape Cardiologist - denies  PPM/ICD - denies   Chest x-ray - 08/13/15 EKG - 11/11/23 Stress Test - denies ECHO - denies Cardiac Cath - denies  Sleep Study - denies   DM- denies  Last dose of GLP1 agonist-  n/a   ASA/Blood Thinner Instructions: n/a   ERAS Protcol - no, NPO   COVID TEST- n/a   Anesthesia review: yes. 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 Also, pt had a very bad experience with a urinary catheter and asks not to be catheterized.    Patient denies shortness of breath, fever, cough and chest pain at PAT appointment   All instructions explained to the patient, with a verbal understanding of the material. Patient agrees to go over the instructions while at home for a better understanding.  The opportunity to ask questions was provided.

## 2023-11-14 NOTE — Progress Notes (Signed)
 Corey Hawkins, with Dr. Lovell Sheehan' office notified about positive PCR results (LVM)

## 2023-11-14 NOTE — Anesthesia Preprocedure Evaluation (Addendum)
 Anesthesia Evaluation  Patient identified by MRN, date of birth, ID band Patient awake    Reviewed: Allergy & Precautions, H&P , NPO status , Patient's Chart, lab work & pertinent test results  Airway Mallampati: II  TM Distance: >3 FB Neck ROM: Full    Dental no notable dental hx.    Pulmonary neg pulmonary ROS   Pulmonary exam normal breath sounds clear to auscultation       Cardiovascular hypertension, Pt. on medications Normal cardiovascular exam Rhythm:Regular Rate:Normal     Neuro/Psych  Headaches  negative psych ROS   GI/Hepatic Neg liver ROS,GERD  ,,  Endo/Other  negative endocrine ROS    Renal/GU negative Renal ROS  negative genitourinary   Musculoskeletal  (+) Arthritis , Osteoarthritis,    Abdominal   Peds negative pediatric ROS (+)  Hematology negative hematology ROS (+)   Anesthesia Other Findings   Reproductive/Obstetrics negative OB ROS                              Anesthesia Physical Anesthesia Plan  ASA: 2  Anesthesia Plan: General   Post-op Pain Management:    Induction: Intravenous  PONV Risk Score and Plan: 2 and Ondansetron, Midazolam and Treatment may vary due to age or medical condition  Airway Management Planned: Oral ETT  Additional Equipment:   Intra-op Plan:   Post-operative Plan: Extubation in OR  Informed Consent: I have reviewed the patients History and Physical, chart, labs and discussed the procedure including the risks, benefits and alternatives for the proposed anesthesia with the patient or authorized representative who has indicated his/her understanding and acceptance.     Dental advisory given  Plan Discussed with: CRNA  Anesthesia Plan Comments: (See PAT note written 11/15/2023 by Allison Zelenak, PA-C.)         Anesthesia Quick Evaluation

## 2023-11-14 NOTE — Progress Notes (Signed)
 Anesthesia Chart Review:  Case: [id number] Date/Time: 11/24/23 1422   Procedure: LUMBAR LAMINECTOMY/ DECOMPRESSION WITH MET-RX (Bilateral) - BILATERAL L34 LAM,FORAM; LEFT L5S1 DISCECTOMY   Anesthesia type: General   Diagnosis: Degenerative lumbar spinal stenosis [M48.061]   Pre-op diagnosis: DEGENERATIVE LUMBAR SPINAL STENOSIS   Location: [facility] OR ROOM 19 / [facility] OR   Surgeons: Tressie Stalker, MD       DISCUSSION: Patient is a sick 68-year-old male scheduled for the above procedure.  History includes never smoker (passive smoke exposure), GERD, trigeminal neuralgia (2021), pancreatitis, skin cancer, spinal surgery (c-34 posterior fusion 2010).   He reported "phobia" of anesthesia and has required pre-meds with Versed. He also reported "a very bad experience" with a urinary catheter and hope to avoid catheter.  Surgery is currently booked for < 2 hours long. Decision for foley will be deferred to surgeon and/or anesthesia team.    He had MRI in 09/2022 due to history of pancreatitis and possible liver lesion. 09/14/23 results included mildly heterogeneous appearance of the area of concern in the RIGHT hepatic lobe without visible lesion (area characterized as focal nodular hyperplasia on previous imaging from 2017). There was mild variable fat deposition of the liver, small amoutn sludge in the gallbladder, mild atrophy in the pancreatic head without lesion/inflammation, mild aortic atherosclerosis. LFTs normal on 11/08/23. LDL 114 on 11/08/23.    He had annual CPE with his PCP Loyola Mast, MD on 11/08/23. He noted surgery plans.   Anesthesia team to evaluate on the day of surgery.   VS: BP (!) 145/92   Pulse 69   Temp 36.9 C   Resp 17   Ht 5\' 11"  (1.803 m)   Wt 90.6 kg   SpO2 100%   BMI 27.87 kg/m    PROVIDERS: Loyola Mast, MD is PCP   LABS: [facility] Labs from 11/08/23 and 11/11/23 noted. Results include: Lab Results  Component Value Date   WBC 11.7 (H) 11/11/2023   HGB 15.2 11/11/2023    HCT 45.7 11/11/2023   PLT 249 11/11/2023   GLUCOSE 100 (H) 11/08/2023   CHOL 196 11/08/2023   TRIG 75.0 11/08/2023   HDL 66.90 11/08/2023   LDLCALC 114 (H) 11/08/2023   ALT 20 11/08/2023   AST 19 11/08/2023   NA 141 11/08/2023   K 4.5 11/08/2023   CL 102 11/08/2023   CREATININE 0.93 11/08/2023   BUN 22 11/08/2023   CO2 29 11/08/2023   PSA 1.47 11/08/2023   INR 1.1 05/13/2021    IMAGES: MRI L-spine 09/19/23: IMPRESSION: 1. Worsened L3-L4 disc bulge and facet arthrosis with severe spinal canal stenosis and buckling of the cauda equina above the L4 level. 2. New/worsened left subarticular disc protrusion at L5-S1 narrowing the left lateral recess and displacing the left S1 nerve root.   MRI Abd 09/13/22: IMPRESSION: 1. Mildly heterogeneous appearance of the area of concern in the RIGHT hepatic lobe without visible lesion, discrete lesion on T1 or T2 weighted imaging or on post-contrast imaging. Area characterized as focal nodular hyperplasia on previous imaging from 2017. 2. Mild variable fat deposition may be present cephalad to the lesion as outlined above. The patient showed signs of fatty infiltration in the pre portal liver on prior imaging. 3. Question of liver disease is raised given presence of gata Ca mass T a. Also correlate with any other reason for gynecomastia such as medication related risk factors. 4. Small amount of sludge in the gallbladder. 5. Mild atrophy in the head  of the pancreas. No focal lesion, ductal dilation or sign of inflammation. 6. Mild aortic atherosclerosis.    EKG: 11/11/23: Normal sinus rhythm Cannot rule out Anterior infarct , age undetermined , but probably artifactual due to V2 lead misplacement When compared with ECG of 15-Jul-2022 10:39, No significant change since last tracing Confirmed by Croitoru, Mihai [phone]) on 11/11/2023 5:51:49 PM  CV: N/A  Past Medical History:  Diagnosis Date   Allergy 1962   Penecillin   Arthritis 1990    Knees   Complication of anesthesia    Pt has a "phobia" of being put to sleep. He does well with versed prior to receiving anesthesia   GERD (gastroesophageal reflux disease)    Headache    w/ trigeminal neuralgia flare-ups   History of kidney stones    in high school   Hypertension    Pancreatitis    Skin cancer    Trigeminal neuralgia     Past Surgical History:  Procedure Laterality Date   BUNIONECTOMY Left    EXCISION MORTON'S NEUROMA Left    KNEE SURGERY     ACL repair and meniscectomy with revisions. Four surgeries total.   POSTERIOR FUSION CERVICAL SPINE     C3-4   SPINE SURGERY  2010   Cervical fusion    MEDICATIONS:  allopurinol (ZYLOPRIM) 300 MG tablet   Ascorbic Acid (VITAMIN C) 1000 MG tablet   b complex vitamins capsule   Cholecalciferol 125 MCG (5000 UT) capsule   colchicine 0.6 MG tablet   cyclobenzaprine (FLEXERIL) 10 MG tablet   diazepam (VALIUM) 5 MG tablet   gabapentin (NEURONTIN) 300 MG capsule   L-ARGININE PO   omeprazole (PRILOSEC) 20 MG capsule   Potassium 99 MG TABS   tadalafil (CIALIS) 20 MG tablet   traMADol (ULTRAM) 50 MG tablet   No current facility-administered medications for this encounter.    Shonna Chock, PA-C Surgical Short Stay/Anesthesiology [facility] Phone [phone] [facility] Phone [phone] 11/14/2023 4:40 PM

## 2023-11-21 DIAGNOSIS — L089 Local infection of the skin and subcutaneous tissue, unspecified: Secondary | ICD-10-CM | POA: Diagnosis not present

## 2023-11-21 DIAGNOSIS — L570 Actinic keratosis: Secondary | ICD-10-CM | POA: Diagnosis not present

## 2023-11-21 DIAGNOSIS — L720 Epidermal cyst: Secondary | ICD-10-CM | POA: Diagnosis not present

## 2023-11-23 NOTE — Progress Notes (Signed)
 Patient verbalized understanding of new arrival time of 0800 tomorrow

## 2023-11-24 ENCOUNTER — Other Ambulatory Visit: Payer: Self-pay

## 2023-11-24 ENCOUNTER — Ambulatory Visit (HOSPITAL_COMMUNITY)

## 2023-11-24 ENCOUNTER — Encounter (HOSPITAL_COMMUNITY): Payer: Self-pay | Admitting: Neurosurgery

## 2023-11-24 ENCOUNTER — Ambulatory Visit (HOSPITAL_COMMUNITY)
Admission: RE | Admit: 2023-11-24 | Discharge: 2023-11-24 | Disposition: A | Discharge status: Home / Self Care | Source: Ambulatory Visit | Attending: Neurosurgery | Admitting: Neurosurgery

## 2023-11-24 ENCOUNTER — Encounter (HOSPITAL_COMMUNITY)
Admission: RE | Disposition: A | Discharge status: Home / Self Care | Payer: Self-pay | Source: Ambulatory Visit | Attending: Neurosurgery

## 2023-11-24 ENCOUNTER — Ambulatory Visit (HOSPITAL_COMMUNITY): Payer: Self-pay | Admitting: Vascular Surgery

## 2023-11-24 ENCOUNTER — Ambulatory Visit (HOSPITAL_BASED_OUTPATIENT_CLINIC_OR_DEPARTMENT_OTHER)

## 2023-11-24 DIAGNOSIS — M5117 Intervertebral disc disorders with radiculopathy, lumbosacral region: Secondary | ICD-10-CM

## 2023-11-24 DIAGNOSIS — K219 Gastro-esophageal reflux disease without esophagitis: Secondary | ICD-10-CM | POA: Insufficient documentation

## 2023-11-24 DIAGNOSIS — M48062 Spinal stenosis, lumbar region with neurogenic claudication: Secondary | ICD-10-CM

## 2023-11-24 DIAGNOSIS — I10 Essential (primary) hypertension: Secondary | ICD-10-CM | POA: Diagnosis not present

## 2023-11-24 DIAGNOSIS — M51369 Other intervertebral disc degeneration, lumbar region without mention of lumbar back pain or lower extremity pain: Secondary | ICD-10-CM | POA: Diagnosis not present

## 2023-11-24 HISTORY — PX: LUMBAR LAMINECTOMY/DECOMPRESSION MICRODISCECTOMY: SHX5026

## 2023-11-24 SURGERY — LUMBAR LAMINECTOMY/DECOMPRESSION MICRODISCECTOMY 2 LEVELS
Anesthesia: General | Site: Spine Lumbar | Laterality: Bilateral

## 2023-11-24 MED ORDER — COLCHICINE 0.6 MG PO TABS: 0.6000 mg | ORAL_TABLET | Freq: Every day | ORAL | Status: DC | PRN

## 2023-11-24 MED ORDER — PHENYLEPHRINE HCL-NACL 20-0.9 MG/250ML-% IV SOLN
INTRAVENOUS | Status: DC | PRN
  Administered 2023-11-24: 30 ug/min via INTRAVENOUS

## 2023-11-24 MED ORDER — HYDROMORPHONE HCL 1 MG/ML IJ SOLN
INTRAMUSCULAR | Status: AC
  Filled 2023-11-24: qty 0.5

## 2023-11-24 MED ORDER — GABAPENTIN 300 MG PO CAPS
300.0000 mg | ORAL_CAPSULE | Freq: Four times a day (QID) | ORAL | Status: DC
  Administered 2023-11-24: 300 mg via ORAL
  Filled 2023-11-24: qty 1

## 2023-11-24 MED ORDER — PHENYLEPHRINE 80 MCG/ML (10ML) SYRINGE FOR IV PUSH (FOR BLOOD PRESSURE SUPPORT)
PREFILLED_SYRINGE | INTRAVENOUS | Status: AC
  Filled 2023-11-24: qty 10

## 2023-11-24 MED ORDER — ROCURONIUM BROMIDE 10 MG/ML (PF) SYRINGE
PREFILLED_SYRINGE | INTRAVENOUS | Status: AC
  Filled 2023-11-24: qty 10

## 2023-11-24 MED ORDER — DEXAMETHASONE SODIUM PHOSPHATE 10 MG/ML IJ SOLN
INTRAMUSCULAR | Status: AC
  Filled 2023-11-24: qty 1

## 2023-11-24 MED ORDER — MIDAZOLAM HCL 2 MG/2ML IJ SOLN
INTRAMUSCULAR | Status: AC
  Filled 2023-11-24: qty 2

## 2023-11-24 MED ORDER — SUGAMMADEX SODIUM 200 MG/2ML IV SOLN
INTRAVENOUS | Status: DC | PRN
  Administered 2023-11-24: 200 mg via INTRAVENOUS

## 2023-11-24 MED ORDER — CHLORHEXIDINE GLUCONATE CLOTH 2 % EX PADS
6.0000 | MEDICATED_PAD | Freq: Once | CUTANEOUS | Status: DC
Start: 2023-11-24 — End: 2023-11-24

## 2023-11-24 MED ORDER — SODIUM CHLORIDE 0.9 % IV SOLN: 12.5000 mg | INTRAVENOUS | Status: DC | PRN

## 2023-11-24 MED ORDER — BACITRACIN ZINC 500 UNIT/GM EX OINT
TOPICAL_OINTMENT | CUTANEOUS | Status: DC | PRN
  Administered 2023-11-24: 1 via TOPICAL

## 2023-11-24 MED ORDER — BISACODYL 10 MG RE SUPP: 10.0000 mg | Freq: Every day | RECTAL | Status: DC | PRN

## 2023-11-24 MED ORDER — ONDANSETRON HCL 4 MG/2ML IJ SOLN
INTRAMUSCULAR | Status: AC
  Filled 2023-11-24: qty 2

## 2023-11-24 MED ORDER — ONDANSETRON HCL 4 MG/2ML IJ SOLN: 4.0000 mg | Freq: Four times a day (QID) | INTRAMUSCULAR | Status: DC | PRN

## 2023-11-24 MED ORDER — OXYCODONE HCL 5 MG PO TABS: 5.0000 mg | ORAL_TABLET | Freq: Once | ORAL | Status: DC | PRN

## 2023-11-24 MED ORDER — ONDANSETRON HCL 4 MG/2ML IJ SOLN
INTRAMUSCULAR | Status: DC | PRN
  Administered 2023-11-24: 4 mg via INTRAVENOUS

## 2023-11-24 MED ORDER — FENTANYL CITRATE (PF) 250 MCG/5ML IJ SOLN
INTRAMUSCULAR | Status: DC | PRN
  Administered 2023-11-24: 100 ug via INTRAVENOUS

## 2023-11-24 MED ORDER — ORAL CARE MOUTH RINSE: 15.0000 mL | Freq: Once | OROMUCOSAL | Status: AC

## 2023-11-24 MED ORDER — SODIUM CHLORIDE 0.9 % IV SOLN: 250.0000 mL | INTRAVENOUS | Status: DC

## 2023-11-24 MED ORDER — ACETAMINOPHEN 325 MG PO TABS
650.0000 mg | ORAL_TABLET | ORAL | Status: DC | PRN
  Administered 2023-11-24: 650 mg via ORAL
  Filled 2023-11-24: qty 2

## 2023-11-24 MED ORDER — LIDOCAINE 2% (20 MG/ML) 5 ML SYRINGE
INTRAMUSCULAR | Status: DC | PRN
  Administered 2023-11-24: 100 mg via INTRAVENOUS

## 2023-11-24 MED ORDER — TADALAFIL 20 MG PO TABS
20.0000 mg | ORAL_TABLET | Freq: Every day | ORAL | Status: DC
Start: 2023-11-24 — End: 2023-11-24
  Filled 2023-11-24: qty 1

## 2023-11-24 MED ORDER — LACTATED RINGERS IV SOLN: INTRAVENOUS | Status: DC

## 2023-11-24 MED ORDER — 0.9 % SODIUM CHLORIDE (POUR BTL) OPTIME
TOPICAL | Status: DC | PRN
  Administered 2023-11-24: 1000 mL

## 2023-11-24 MED ORDER — BUPIVACAINE-EPINEPHRINE (PF) 0.5% -1:200000 IJ SOLN
INTRAMUSCULAR | Status: AC
  Filled 2023-11-24: qty 30

## 2023-11-24 MED ORDER — PANTOPRAZOLE SODIUM 40 MG PO TBEC: 80.0000 mg | DELAYED_RELEASE_TABLET | Freq: Every day | ORAL | Status: DC

## 2023-11-24 MED ORDER — CHLORHEXIDINE GLUCONATE CLOTH 2 % EX PADS: 6.0000 | MEDICATED_PAD | Freq: Once | CUTANEOUS | Status: DC

## 2023-11-24 MED ORDER — LIDOCAINE 2% (20 MG/ML) 5 ML SYRINGE
INTRAMUSCULAR | Status: AC
  Filled 2023-11-24: qty 5

## 2023-11-24 MED ORDER — ALLOPURINOL 300 MG PO TABS
300.0000 mg | ORAL_TABLET | Freq: Every day | ORAL | Status: DC
  Filled 2023-11-24: qty 1

## 2023-11-24 MED ORDER — DOCUSATE SODIUM 100 MG PO CAPS
100.0000 mg | ORAL_CAPSULE | Freq: Two times a day (BID) | ORAL | Status: DC
  Administered 2023-11-24: 100 mg via ORAL
  Filled 2023-11-24: qty 1

## 2023-11-24 MED ORDER — OXYCODONE HCL 5 MG PO TABS: 10.0000 mg | ORAL_TABLET | ORAL | Status: DC | PRN

## 2023-11-24 MED ORDER — HYDROMORPHONE HCL 1 MG/ML IJ SOLN: 0.2500 mg | INTRAMUSCULAR | Status: DC | PRN

## 2023-11-24 MED ORDER — CYCLOBENZAPRINE HCL 10 MG PO TABS: 10.0000 mg | ORAL_TABLET | Freq: Three times a day (TID) | ORAL | Status: DC | PRN

## 2023-11-24 MED ORDER — VANCOMYCIN HCL IN DEXTROSE 1-5 GM/200ML-% IV SOLN
1000.0000 mg | INTRAVENOUS | Status: AC
  Administered 2023-11-24: 1000 mg via INTRAVENOUS
  Filled 2023-11-24: qty 200

## 2023-11-24 MED ORDER — PROPOFOL 10 MG/ML IV BOLUS
INTRAVENOUS | Status: DC | PRN
  Administered 2023-11-24: 150 mg via INTRAVENOUS

## 2023-11-24 MED ORDER — SODIUM CHLORIDE 0.9% FLUSH: 3.0000 mL | INTRAVENOUS | Status: DC | PRN

## 2023-11-24 MED ORDER — DEXAMETHASONE SODIUM PHOSPHATE 10 MG/ML IJ SOLN
INTRAMUSCULAR | Status: DC | PRN
  Administered 2023-11-24: 8 mg via INTRAVENOUS

## 2023-11-24 MED ORDER — ROCURONIUM BROMIDE 10 MG/ML (PF) SYRINGE
PREFILLED_SYRINGE | INTRAVENOUS | Status: DC | PRN
  Administered 2023-11-24: 10 mg via INTRAVENOUS
  Administered 2023-11-24: 90 mg via INTRAVENOUS
  Administered 2023-11-24: 10 mg via INTRAVENOUS

## 2023-11-24 MED ORDER — FENTANYL CITRATE (PF) 250 MCG/5ML IJ SOLN
INTRAMUSCULAR | Status: AC
  Filled 2023-11-24: qty 5

## 2023-11-24 MED ORDER — ONDANSETRON HCL 4 MG PO TABS: 4.0000 mg | ORAL_TABLET | Freq: Four times a day (QID) | ORAL | Status: DC | PRN

## 2023-11-24 MED ORDER — THROMBIN 5000 UNITS EX KIT
PACK | CUTANEOUS | Status: AC
  Filled 2023-11-24: qty 1

## 2023-11-24 MED ORDER — HYDROMORPHONE HCL 1 MG/ML IJ SOLN
INTRAMUSCULAR | Status: DC | PRN
  Administered 2023-11-24 (×2): .25 mg via INTRAVENOUS

## 2023-11-24 MED ORDER — SODIUM CHLORIDE 0.9% FLUSH
3.0000 mL | Freq: Two times a day (BID) | INTRAVENOUS | Status: DC
  Administered 2023-11-24: 3 mL via INTRAVENOUS

## 2023-11-24 MED ORDER — MENTHOL 3 MG MT LOZG: 1.0000 | LOZENGE | OROMUCOSAL | Status: DC | PRN

## 2023-11-24 MED ORDER — OXYCODONE HCL 5 MG PO TABS: 5.0000 mg | ORAL_TABLET | ORAL | Status: DC | PRN

## 2023-11-24 MED ORDER — BACITRACIN ZINC 500 UNIT/GM EX OINT
TOPICAL_OINTMENT | CUTANEOUS | Status: AC
  Filled 2023-11-24: qty 28.35

## 2023-11-24 MED ORDER — AMISULPRIDE (ANTIEMETIC) 5 MG/2ML IV SOLN: 10.0000 mg | Freq: Once | INTRAVENOUS | Status: DC | PRN

## 2023-11-24 MED ORDER — CHLORHEXIDINE GLUCONATE 0.12 % MT SOLN
15.0000 mL | Freq: Once | OROMUCOSAL | Status: AC
  Administered 2023-11-24: 15 mL via OROMUCOSAL
  Filled 2023-11-24: qty 15

## 2023-11-24 MED ORDER — MORPHINE SULFATE (PF) 2 MG/ML IV SOLN: 2.0000 mg | INTRAVENOUS | Status: DC | PRN

## 2023-11-24 MED ORDER — VANCOMYCIN HCL IN DEXTROSE 1-5 GM/200ML-% IV SOLN: 1000.0000 mg | Freq: Once | INTRAVENOUS | Status: DC

## 2023-11-24 MED ORDER — SODIUM CHLORIDE 0.9 % IV SOLN: INTRAVENOUS | Status: DC | PRN

## 2023-11-24 MED ORDER — MEPERIDINE HCL 25 MG/ML IJ SOLN: 6.2500 mg | INTRAMUSCULAR | Status: DC | PRN

## 2023-11-24 MED ORDER — OXYCODONE HCL 5 MG/5ML PO SOLN: 5.0000 mg | Freq: Once | ORAL | Status: DC | PRN

## 2023-11-24 MED ORDER — PHENOL 1.4 % MT LIQD: 1.0000 | OROMUCOSAL | Status: DC | PRN

## 2023-11-24 MED ORDER — MIDAZOLAM HCL 2 MG/2ML IJ SOLN
INTRAMUSCULAR | Status: DC | PRN
  Administered 2023-11-24: 2 mg via INTRAVENOUS

## 2023-11-24 MED ORDER — PHENYLEPHRINE 80 MCG/ML (10ML) SYRINGE FOR IV PUSH (FOR BLOOD PRESSURE SUPPORT)
PREFILLED_SYRINGE | INTRAVENOUS | Status: DC | PRN
  Administered 2023-11-24: 80 ug via INTRAVENOUS

## 2023-11-24 MED ORDER — BUPIVACAINE-EPINEPHRINE (PF) 0.5% -1:200000 IJ SOLN
INTRAMUSCULAR | Status: DC | PRN
  Administered 2023-11-24 (×2): 10 mL

## 2023-11-24 MED ORDER — ACETAMINOPHEN 500 MG PO TABS: 1000.0000 mg | ORAL_TABLET | Freq: Four times a day (QID) | ORAL | Status: DC

## 2023-11-24 MED ORDER — ACETAMINOPHEN 650 MG RE SUPP: 650.0000 mg | RECTAL | Status: DC | PRN

## 2023-11-24 MED ORDER — THROMBIN 5000 UNITS EX SOLR
OROMUCOSAL | Status: DC | PRN
  Administered 2023-11-24: 5 mL via TOPICAL

## 2023-11-24 SURGICAL SUPPLY — 43 items
BAG COUNTER SPONGE SURGICOUNT (BAG) ×1 IMPLANT
BAND RUBBER #18 3X1/16 STRL (MISCELLANEOUS) ×2 IMPLANT
BENZOIN TINCTURE PRP APPL 2/3 (GAUZE/BANDAGES/DRESSINGS) ×1 IMPLANT
BLADE CLIPPER SURG (BLADE) IMPLANT
BUR MATCHSTICK NEURO 3.0 LAGG (BURR) ×1 IMPLANT
BUR PRECISION FLUTE 6.0 (BURR) ×1 IMPLANT
CANISTER SUCT 3000ML PPV (MISCELLANEOUS) ×1 IMPLANT
DERMABOND ADVANCED .7 DNX12 (GAUZE/BANDAGES/DRESSINGS) IMPLANT
DRAPE LAPAROTOMY 100X72X124 (DRAPES) ×1 IMPLANT
DRAPE MICROSCOPE SLANT 54X150 (MISCELLANEOUS) ×1 IMPLANT
DRAPE SURG 17X23 STRL (DRAPES) ×4 IMPLANT
DRSG OPSITE POSTOP 4X6 (GAUZE/BANDAGES/DRESSINGS) ×1 IMPLANT
ELECT BLADE 4.0 EZ CLEAN MEGAD (MISCELLANEOUS) ×1 IMPLANT
ELECT REM PT RETURN 9FT ADLT (ELECTROSURGICAL) ×1 IMPLANT
ELECTRODE BLDE 4.0 EZ CLN MEGD (MISCELLANEOUS) ×1 IMPLANT
ELECTRODE REM PT RTRN 9FT ADLT (ELECTROSURGICAL) ×1 IMPLANT
GAUZE 4X4 16PLY ~~LOC~~+RFID DBL (SPONGE) IMPLANT
GAUZE SPONGE 4X4 12PLY STRL (GAUZE/BANDAGES/DRESSINGS) ×1 IMPLANT
GLOVE BIO SURGEON STRL SZ 6 (GLOVE) ×1 IMPLANT
GLOVE BIO SURGEON STRL SZ8 (GLOVE) ×1 IMPLANT
GLOVE BIO SURGEON STRL SZ8.5 (GLOVE) ×1 IMPLANT
GLOVE BIOGEL PI IND STRL 6.5 (GLOVE) ×1 IMPLANT
GLOVE EXAM NITRILE XL STR (GLOVE) IMPLANT
GOWN STRL REUS W/ TWL LRG LVL3 (GOWN DISPOSABLE) ×1 IMPLANT
GOWN STRL REUS W/ TWL XL LVL3 (GOWN DISPOSABLE) ×1 IMPLANT
GOWN STRL REUS W/TWL 2XL LVL3 (GOWN DISPOSABLE) IMPLANT
HEMOSTAT POWDER KIT SURGIFOAM (HEMOSTASIS) ×1 IMPLANT
KIT BASIN OR (CUSTOM PROCEDURE TRAY) ×1 IMPLANT
KIT POSITION SURG JACKSON T1 (MISCELLANEOUS) IMPLANT
KIT TURNOVER KIT B (KITS) ×1 IMPLANT
NDL HYPO 22X1.5 SAFETY MO (MISCELLANEOUS) ×1 IMPLANT
NEEDLE HYPO 22X1.5 SAFETY MO (MISCELLANEOUS) ×1 IMPLANT
NS IRRIG 1000ML POUR BTL (IV SOLUTION) ×1 IMPLANT
PACK LAMINECTOMY NEURO (CUSTOM PROCEDURE TRAY) ×1 IMPLANT
PAD ARMBOARD POSITIONER FOAM (MISCELLANEOUS) ×3 IMPLANT
PATTIES SURGICAL .5 X1 (DISPOSABLE) IMPLANT
SPONGE SURGIFOAM ABS GEL SZ50 (HEMOSTASIS) IMPLANT
STRIP CLOSURE SKIN 1/2X4 (GAUZE/BANDAGES/DRESSINGS) ×1 IMPLANT
SUT VIC AB 1 CT1 18XBRD ANBCTR (SUTURE) ×2 IMPLANT
SUT VIC AB 2-0 CP2 18 (SUTURE) ×2 IMPLANT
TOWEL GREEN STERILE (TOWEL DISPOSABLE) ×1 IMPLANT
TOWEL GREEN STERILE FF (TOWEL DISPOSABLE) ×1 IMPLANT
WATER STERILE IRR 1000ML POUR (IV SOLUTION) ×1 IMPLANT

## 2023-11-24 NOTE — Plan of Care (Signed)
 Patient educated, no further questions. IV removed.   Problem: Education: Goal: Knowledge of General Education information will improve Description: Including pain rating scale, medication(s)/side effects and non-pharmacologic comfort measures Outcome: Adequate for Discharge   Problem: Health Behavior/Discharge Planning: Goal: Ability to manage health-related needs will improve Outcome: Adequate for Discharge   Problem: Clinical Measurements: Goal: Ability to maintain clinical measurements within normal limits will improve Outcome: Adequate for Discharge Goal: Will remain free from infection Outcome: Adequate for Discharge Goal: Diagnostic test results will improve Outcome: Adequate for Discharge Goal: Respiratory complications will improve Outcome: Adequate for Discharge Goal: Cardiovascular complication will be avoided Outcome: Adequate for Discharge   Problem: Activity: Goal: Risk for activity intolerance will decrease Outcome: Adequate for Discharge   Problem: Nutrition: Goal: Adequate nutrition will be maintained Outcome: Adequate for Discharge   Problem: Coping: Goal: Level of anxiety will decrease Outcome: Adequate for Discharge   Problem: Elimination: Goal: Will not experience complications related to bowel motility Outcome: Adequate for Discharge Goal: Will not experience complications related to urinary retention Outcome: Adequate for Discharge   Problem: Pain Managment: Goal: General experience of comfort will improve and/or be controlled Outcome: Adequate for Discharge   Problem: Safety: Goal: Ability to remain free from injury will improve Outcome: Adequate for Discharge   Problem: Skin Integrity: Goal: Risk for impaired skin integrity will decrease Outcome: Adequate for Discharge   Problem: Education: Goal: Ability to verbalize activity precautions or restrictions will improve Outcome: Adequate for Discharge Goal: Knowledge of the prescribed  therapeutic regimen will improve Outcome: Adequate for Discharge Goal: Understanding of discharge needs will improve Outcome: Adequate for Discharge   Problem: Activity: Goal: Ability to avoid complications of mobility impairment will improve Outcome: Adequate for Discharge Goal: Ability to tolerate increased activity will improve Outcome: Adequate for Discharge Goal: Will remain free from falls Outcome: Adequate for Discharge   Problem: Bowel/Gastric: Goal: Gastrointestinal status for postoperative course will improve Outcome: Adequate for Discharge   Problem: Clinical Measurements: Goal: Ability to maintain clinical measurements within normal limits will improve Outcome: Adequate for Discharge Goal: Postoperative complications will be avoided or minimized Outcome: Adequate for Discharge Goal: Diagnostic test results will improve Outcome: Adequate for Discharge   Problem: Pain Management: Goal: Pain level will decrease Outcome: Adequate for Discharge   Problem: Skin Integrity: Goal: Will show signs of wound healing Outcome: Adequate for Discharge   Problem: Health Behavior/Discharge Planning: Goal: Identification of resources available to assist in meeting health care needs will improve Outcome: Adequate for Discharge   Problem: Bladder/Genitourinary: Goal: Urinary functional status for postoperative course will improve Outcome: Adequate for Discharge

## 2023-11-24 NOTE — Op Note (Signed)
 Brief history: The patient is a 68 year old white male who has a chronic back pain and leg pain.  He has failed medical management.  Was worked up with a lumbar MRI which demonstrated spinal stenosis at L3-4 and a herniated disc at L5-S1.  I discussed the various treatment options with him.  He has decided proceed with surgery.   Preoperative diagnosis: Lumbar spinal stenosis with neurogenic claudication, lumbosacral herniated disc, lumbosacral radiculopathy, lumbago  Postoperative diagnosis: The same  Procedure: Bilateral L3-4 laminotomies/foraminotomies and left L5-S1 discectomy  using micro-dissection  Surgeon: Dr. Delma Officer  Asst.: Dr. Coletta Memos and Hildred Priest, NP  Anesthesia: Gen. endotracheal  Estimated blood loss: 50 cc  Drains: None  Complications: None  Description of procedure: The patient was brought to the operating room by the anesthesia team. General endotracheal anesthesia was induced. The patient was turned to the prone position on the Wilson frame. The patient's lumbosacral region was then prepared with Betadine scrub and Betadine solution. Sterile drapes were applied.  I then injected the area to be incised with Marcaine with epinephrine solution. I then used a scalpel to make a linear midline incision over the L3-4, L4-5 and L5-S1 intervertebral disc space. I then used electrocautery to perform a left sided subperiosteal dissection exposing the spinous process and lamina of L3-4, L4-5 and L5-S1. We obtained intraoperative radiograph to confirm our location. I then inserted the [facility] retractor for exposure.  We then brought the operative microscope into the field. Under its magnification and illumination we completed the microdissection. I used a high-speed drill to perform a laminotomy at L3-4 and L5-S1 on the left.  I drilled across the midline at L3-4 performing a right L3-4 laminotomy from the left side.  I then used a Kerrison punches to widen the l  laminotomies bilaterally at L3-4 and at L5-S1 the left and removed the ligamentum flavum at L3-4 bilaterally and L5-S1 on the left. We then used microdissection to free up the thecal sac and the bilateral L4 and left S1 nerve root from the epidural tissue. I then used a Kerrison punch to perform a foraminotomy at about the bilateral L4 and left S1 nerve root. We then using the nerve root retractor to gently retract the thecal sac and the left S1 nerve root medially. This exposed the intervertebral disc at L5-S1. We identified the ruptured disc and remove it with the pituitary forceps.  There was a significant hole in the annulus and we performed a partial intervertebral discectomy with the pituitary forceps.  I then palpated along the ventral surface of the thecal sac and along exit route of the left L4 and left S1 nerve root and noted that the neural structures were well decompressed. This completed the decompression.  We then obtained hemostasis using bipolar electrocautery. We irrigated the wound out with saline solution. We then removed the retractor. We then reapproximated the patient's thoracolumbar fascia with interrupted #1 Vicryl suture. We then reapproximated the patient's subcutaneous tissue with interrupted 2-0 Vicryl suture. We then reapproximated patient's skin with Steri-Strips and benzoin. The was then coated with bacitracin ointment. The drapes were removed. The patient was subsequently returned to the supine position where they were extubated by the anesthesia team. The patient was then transported to the postanesthesia care unit in stable condition. All sponge instrument and needle counts were reportedly correct at the end of this case.

## 2023-11-24 NOTE — Anesthesia Postprocedure Evaluation (Signed)
 Anesthesia Post Note  Patient: Corey Hawkins  Procedure(s) Performed: BILATERAL LUMBAR THREE-FOUR LAMINECTOMY,FORAMINOTOMY; LEFT LUMBAR FIVE-SACRAL ONE DISCECTOMY (Bilateral: Spine Lumbar)     Patient location during evaluation: PACU Anesthesia Type: General Level of consciousness: awake Pain management: pain level controlled Vital Signs Assessment: post-procedure vital signs reviewed and stable Respiratory status: spontaneous breathing, nonlabored ventilation and respiratory function stable Cardiovascular status: blood pressure returned to baseline and stable Postop Assessment: no apparent nausea or vomiting Anesthetic complications: no   No notable events documented.  Last Vitals:  Vitals:   11/24/23 1345 11/24/23 1423  BP: (!) 140/76 (!) 152/80  Pulse: 63 60  Resp: 16 16  Temp: 36.4 C   SpO2: 96%     Last Pain:  Vitals:   11/24/23 1553  TempSrc:   PainSc: 4                  Conard Decent

## 2023-11-24 NOTE — Discharge Summary (Signed)
 Physician Discharge Summary  Patient ID: Corey Hawkins MRN: [id number] DOB/AGE: [birth year] 68 y.o.  Admit date: 11/24/2023 Discharge date: 11/24/2023  Admission Diagnoses:Lumbar spinal stenosis with neurogenic claudication, lumbosacral herniated disc, lumbosacral radiculopathy, lumbago   Discharge Diagnoses:Lumbar spinal stenosis with neurogenic claudication, lumbosacral herniated disc, lumbosacral radiculopathy, lumbago   Principal Problem:   Spinal stenosis of lumbar region with neurogenic claudication   Discharged Condition: good  Hospital Course: Corey Hawkins was admitted and taken to the operating room for an uncomplicated lumbar L3/4 decompression, and a discetomy at L5/S1 on the left. Post op he is ambulating, voiding, and tolerating a regular diet. The wound is clean dry and without signs of infection.   Treatments: surgery: as above  Discharge Exam: Blood pressure (!) 152/80, pulse 60, temperature 97.6 F (36.4 C), resp. rate 16, height (P) 5\' 11"  (1.803 m), weight (P) 89.8 kg, SpO2 96%. General appearance: alert, cooperative, appears stated age, and no distress Neurologic: Alert and oriented X 3, normal strength and tone. Normal symmetric reflexes. Normal coordination and gait  Disposition: Discharge disposition: 01-Home or Self Care      DEGENERATIVE LUMBAR SPINAL STENOSIS Discharge Instructions     Discharge instructions   Complete by: As directed       Allergies as of 11/24/2023       Reactions   Vibramycin [doxycycline Hyclate] Other (See Comments)   All "mycins"; cause mouth sores   Penicillins Rash   "all 'cillins" Has patient had a PCN reaction causing immediate rash, facial/tongue/throat swelling, SOB or lightheadedness with hypotension: Yes Has patient had a PCN reaction causing severe rash involving mucus membranes or skin necrosis: No Has patient had a PCN reaction that required hospitalization No Has patient had a PCN reaction occurring within the  last 10 years: No If all of the above answers are "NO", then may proceed with Cephalosporin use.        Medication List     TAKE these medications    allopurinol 300 MG tablet Commonly known as: ZYLOPRIM Take 1 tablet (300 mg total) by mouth daily.   b complex vitamins capsule Take 1 capsule by mouth daily.   Cholecalciferol 125 MCG (5000 UT) capsule Take 5,000 Units by mouth daily.   colchicine 0.6 MG tablet Take 0.6 mg by mouth daily as needed (gout attacks).   cyclobenzaprine 10 MG tablet Commonly known as: FLEXERIL Take 10 mg by mouth at bedtime as needed for muscle spasms.   diazepam 5 MG tablet Commonly known as: VALIUM Take 1 tablet (5 mg total) by mouth every 8 (eight) hours as needed for muscle spasms.   gabapentin 300 MG capsule Commonly known as: NEURONTIN Take 300 mg by mouth 4 (four) times daily.   L-ARGININE PO Take 1 tablet by mouth daily.   omeprazole 20 MG capsule Commonly known as: PRILOSEC Take 20 mg by mouth daily.   Potassium 99 MG Tabs Take 99 mg by mouth daily.   tadalafil 20 MG tablet Commonly known as: CIALIS Take 1 tablet (20 mg total) by mouth daily.   traMADol 50 MG tablet Commonly known as: ULTRAM 1-2 tablets every 6 hours as needed for pain   vitamin C 1000 MG tablet Take 1,000 mg by mouth daily as needed (cold symptoms).        Follow-up Information     Tressie Stalker, MD Follow up.   Specialty: Neurosurgery Contact information: [address] [place] [address] [phone]  SignedAudie Bleacher 11/24/2023, 6:24 PM

## 2023-11-24 NOTE — Transfer of Care (Signed)
 Immediate Anesthesia Transfer of Care Note  Patient: Corey Hawkins  Procedure(s) Performed: BILATERAL LUMBAR THREE-FOUR LAMINECTOMY,FORAMINOTOMY; LEFT LUMBAR FIVE-SACRAL ONE DISCECTOMY (Bilateral: Spine Lumbar)  Patient Location: PACU  Anesthesia Type:General  Level of Consciousness: awake, alert , and oriented  Airway & Oxygen Therapy: Patient Spontanous Breathing and Patient connected to face mask oxygen  Post-op Assessment: Report given to RN and Post -op Vital signs reviewed and stable  Post vital signs: Reviewed and stable  Last Vitals:  Vitals Value Taken Time  BP 145/79 11/24/23 1245  Temp    Pulse 75 11/24/23 1245  Resp 13 11/24/23 1245  SpO2 97 % 11/24/23 1245  Vitals shown include unfiled device data.  Last Pain:  Vitals:   11/24/23 0836  TempSrc:   PainSc: 0-No pain         Complications: No notable events documented.

## 2023-11-24 NOTE — H&P (Signed)
 Subjective: The patient is a 68 year old white male who was complained of chronic back pain into his right greater left buttock and leg.  He has failed medical management and was worked up with a lumbar MRI which demonstrated lumbar spinal stenosis and a lumbar herniated disc.  I discussed the various treatment options with him.  He has decided proceed with surgery.  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 He does well with versed prior to receiving anesthesia   GERD (gastroesophageal reflux disease)    Headache    w/ trigeminal neuralgia flare-ups   History of kidney stones    in high school   Hypertension    Pancreatitis    Skin cancer    Trigeminal neuralgia     Past Surgical History:  Procedure Laterality Date   BUNIONECTOMY Left    EXCISION MORTON'S NEUROMA Left    KNEE SURGERY     ACL repair and meniscectomy with revisions. Four surgeries total.   POSTERIOR FUSION CERVICAL SPINE     C3-4   SPINE SURGERY  2010   Cervical fusion    Allergies  Allergen Reactions   Vibramycin [Doxycycline Hyclate] Other (See Comments)    All "mycins"; cause mouth sores   Penicillins Rash    "all 'cillins" Has patient had a PCN reaction causing immediate rash, facial/tongue/throat swelling, SOB or lightheadedness with hypotension: Yes Has patient had a PCN reaction causing severe rash involving mucus membranes or skin necrosis: No Has patient had a PCN reaction that required hospitalization No Has patient had a PCN reaction occurring within the last 10 years: No If all of the above answers are "NO", then may proceed with Cephalosporin use.     Social History   Tobacco Use   Smoking status: Never    Passive exposure: Yes   Smokeless tobacco: Never  Substance Use Topics   Alcohol use: Yes    Alcohol/week: 2.0 standard drinks of alcohol    Types: 2 Standard  drinks or equivalent per week    Comment: 2 a week, not always    Family History  Problem Relation Age of Onset   Cancer Mother        Lung (non-smoker)   Gallbladder disease Mother    Heart disease Father    Gallbladder disease Father    Cancer Sister        Cervical   Gallbladder disease Brother    Cancer Maternal Uncle    Heart disease Maternal Grandmother    Diabetes Maternal Grandmother    Heart disease Maternal Grandfather    Heart disease Paternal Grandfather    Cancer Sister    Prior to Admission medications   Medication Sig Start Date End Date Taking? Authorizing Provider  allopurinol (ZYLOPRIM) 300 MG tablet Take 1 tablet (300 mg total) by mouth daily. 09/12/23  Yes Loyola Mast, MD  Ascorbic Acid (VITAMIN C) 1000 MG tablet Take 1,000 mg by mouth daily as needed (cold symptoms).   Yes [provider]  b complex vitamins capsule Take 1 capsule by mouth daily.   Yes [provider]  Cholecalciferol 125 MCG (5000 UT) capsule Take 5,000 Units by mouth daily. 06/11/20  Yes [provider]  cyclobenzaprine (FLEXERIL) 10 MG tablet Take 10 mg by mouth at bedtime as needed for muscle spasms.  Yes [provider]  gabapentin (NEURONTIN) 300 MG capsule Take 300 mg by mouth 4 (four) times daily.   Yes [provider]  L-ARGININE PO Take 1 tablet by mouth daily.   Yes [provider]  omeprazole (PRILOSEC) 20 MG capsule Take 20 mg by mouth daily.   Yes [provider]  Potassium 99 MG TABS Take 99 mg by mouth daily.   Yes [provider]  tadalafil (CIALIS) 20 MG tablet Take 1 tablet (20 mg total) by mouth daily. 09/06/23  Yes Graig Lawyer, MD  traMADol [place] Sick) 50 MG tablet 1-2 tablets every 6 hours as needed for pain 05/14/21  Yes Kehrli, Kelsey F, PA-C  colchicine 0.6 MG tablet Take 0.6 mg by mouth daily as needed (gout attacks). 11/13/19   [provider]  diazepam (VALIUM) 5 MG tablet Take 1 tablet (5  mg total) by mouth every 8 (eight) hours as needed for muscle spasms. 08/17/15   Marilee Showers, MD     Review of Systems  Positive ROS: As above  All other systems have been reviewed and were otherwise negative with the exception of those mentioned in the HPI and as above.  Objective: Vital signs in last 24 hours: Temp:  [98.8 F (37.1 C)] (P) 98.8 F (37.1 C) (04/17 0815) Pulse Rate:  [73] (P) 73 (04/17 0815) Resp:  [17] (P) 17 (04/17 0815) BP: (P) 163/93 (04/17 0815) SpO2:  [100 %] (P) 100 % (04/17 0815) Weight:  [89.8 kg] (P) 89.8 kg (04/17 0815) Estimated body mass index is 27.62 kg/m (pended) as calculated from the following:   Height as of this encounter: (P) 5\' 11"  (1.803 m).   Weight as of this encounter: (P) 89.8 kg.   General Appearance: Alert Head: Normocephalic, without obvious abnormality, atraumatic Eyes: PERRL, conjunctiva/corneas clear, EOM's intact,    Ears: Normal  Throat: Normal  Neck: Supple, Back: unremarkable Lungs: Clear to auscultation bilaterally, respirations unlabored Heart: Regular rate and rhythm, no murmur, rub or gallop Abdomen: Soft, non-tender Extremities: Extremities normal, atraumatic, no cyanosis or edema Skin: unremarkable  NEUROLOGIC:   Mental status: alert and oriented,Motor Exam - grossly normal Sensory Exam - grossly normal Reflexes:  Coordination - grossly normal Gait - grossly normal Balance - grossly normal Cranial Nerves: I: smell Not tested  II: visual acuity  OS: Normal  OD: Normal   II: visual fields Full to confrontation  II: pupils Equal, round, reactive to light  III,VII: ptosis None  III,IV,VI: extraocular muscles  Full ROM  V: mastication Normal  V: facial light touch sensation  Normal  V,VII: corneal reflex  Present  VII: facial muscle function - upper  Normal  VII: facial muscle function - lower Normal  VIII: hearing Not tested  IX: soft palate elevation  Normal  IX,X: gag reflex Present  XI:  trapezius strength  5/5  XI: sternocleidomastoid strength 5/5  XI: neck flexion strength  5/5  XII: tongue strength  Normal    Data Review Lab Results  Component Value Date   WBC 11.7 (H) 11/11/2023   HGB 15.2 11/11/2023   HCT 45.7 11/11/2023   MCV 93.5 11/11/2023   PLT 249 11/11/2023   Lab Results  Component Value Date   NA 141 11/08/2023   K 4.5 11/08/2023   CL 102 11/08/2023   CO2 29 11/08/2023   BUN 22 11/08/2023   CREATININE 0.93 11/08/2023   GLUCOSE 100 (H) 11/08/2023   Lab Results  Component Value  Date   INR 1.1 05/13/2021    Assessment/Plan: Lumbar spinal stenosis, lumbar disc, lumbar radiculopathy, neurogenic claudication, lumbago: I discussed the situation with the patient.  I reviewed his MRI scan with him and pointed out the abnormalities.  We have discussed the various treatment options including surgery.  I have described the surgical treatment option of bilateral L3-4 l laminotomies/foraminotomies and a left L5-S1 discectomy to him.  I have shown him surgical models.  I have given him a surgical pamphlet.  We have discussed the risk, benefits, alternatives, expected postop course, and likelihood of achieving our goals with surgery.  I have answered all his questions.  He has decided proceed with surgery.   Elder Greening 11/24/2023 9:32 AM

## 2023-11-24 NOTE — Discharge Instructions (Signed)
Lumbar Laminectomy Care After A laminectomy is an operation performed on the spine. The purpose is to decompress the spinal cord and/or the nerve roots.  The time in surgery depends on the findings in surgery and what is necessary to correct the problems. HOME CARE INSTRUCTIONS   Check the cut (incision) made by the surgeon twice a day for signs of infection. Some signs of infection may include:   A foul smelling, greenish or yellowish discharge from the wound.   Increased pain.   Increased redness over the incision (operative) site.   The skin edges may separate.   Flu-like symptoms (problems).   A temperature above 101.5 F (38.6 C).   Change your bandages in about 24 to 36 hours following surgery or as directed.   You may shower tomorrow. Avoid bathtubs, swimming pools and hot tubs for three weeks or until your incision has healed completely. If you have stitches or staples, they may be removed 2 to 3 weeks after surgery, or as directed by your doctor. This may be done by your doctor or caregiver.   You may walk as much as you like. No need to exercise at this time. Limit lifting to ~10lbs.  Weight reduction may be beneficial if you are overweight.   Daily exercise is helpful to prevent the return of problems. Walking is permitted. You may use a treadmill without an incline. Cut down on activities and exercise if you have discomfort. You may also go up and down stairs as much as you can tolerate.   DO NOT lift anything heavier than 10 . Avoid bending or twisting at the waist. Always bend your knees when lifting.   Maintain strength and range of motion as instructed.   Do not drive for 2 to 3 weeks, or as directed by your doctors. You may be a passenger for 20 to 30 minute trips. Lying back in the passenger seat may be more comfortable for you. Always wear a seatbelt.   Limit your sitting in a regular chair to 20 to 30 minutes at a time. There are no limitations for sitting in a  recliner. You should lie down or walk in between sitting periods.   Only take over-the-counter or prescription medicines for pain, discomfort, or fever as directed by your caregiver.  SEEK MEDICAL CARE IF:   There is increased bleeding (more than a small spot) from the wound.   You notice redness, swelling, or increasing pain in the wound.   Pus is coming from wound.   You develop an unexplained oral temperature above 102 F (38.9 C) develops.   You notice a foul smell coming from the wound or dressing.   You have increasing pain in your wound.  SEEK IMMEDIATE MEDICAL CARE IF:   You develop a rash.   You have difficulty breathing.   You develop any allergic problems to medicines given.   

## 2023-11-24 NOTE — Anesthesia Procedure Notes (Signed)
 Procedure Name: Intubation Date/Time: 11/24/2023 10:34 AM  Performed by: Basilio Limb, CRNAPre-anesthesia Checklist: Patient identified, Emergency Drugs available, Suction available and Patient being monitored Patient Re-evaluated:Patient Re-evaluated prior to induction Oxygen Delivery Method: Circle System Utilized Preoxygenation: Pre-oxygenation with 100% oxygen Induction Type: IV induction Ventilation: Mask ventilation without difficulty Laryngoscope Size: Glidescope and 4 Grade View: Grade I Tube type: Oral Tube size: 7.5 mm Number of attempts: 1 Airway Equipment and Method: Stylet and Video-laryngoscopy Placement Confirmation: ETT inserted through vocal cords under direct vision, positive ETCO2 and breath sounds checked- equal and bilateral Secured at: 22 cm Tube secured with: Tape Dental Injury: Teeth and Oropharynx as per pre-operative assessment

## 2023-11-25 ENCOUNTER — Encounter (HOSPITAL_COMMUNITY): Payer: Self-pay | Admitting: Neurosurgery

## 2023-12-06 ENCOUNTER — Telehealth: Payer: Self-pay

## 2023-12-06 NOTE — Telephone Encounter (Signed)
 Copied from CRM [id number]. Topic: Clinical - Medication Refill >> Dec 05, 2023  3:26 PM Annelle Kiel wrote: Most Recent Primary Care Visit:  Provider: Graig Lawyer  Department: [facility]  Visit Type: PHYSICAL  Date: 11/08/2023  Medication:   [id number]     allopurinol  (ZYLOPRIM ) 300 MG tablet Has the patient contacted their pharmacy? Yes (Agent: If no, request that the patient contact the pharmacy for the refill. If patient does not wish to contact the pharmacy document the reason why and proceed with request.) (Agent: If yes, when and what did the pharmacy advise?)  Is this the correct pharmacy for this prescription? Yes If no, delete pharmacy and type the correct one.  This is the patient's preferred pharmacy:   [facility] - [place], [place] - [address] [place] [address] Phone: [phone] Fax: [phone]   Has the prescription been filled recently? Yes  Is the patient out of the medication? Yes  Has the patient been seen for an appointment in the last year OR does the patient have an upcoming appointment? Yes  Can we respond through MyChart? No  Agent: Please be advised that Rx refills may take up to 3 business days. We ask that you follow-up with your pharmacy.

## 2023-12-06 NOTE — Telephone Encounter (Signed)
 RX already sent 09/12/23 to [facility] . Dm/cma

## 2023-12-08 ENCOUNTER — Encounter: Payer: Self-pay | Admitting: Family Medicine

## 2023-12-08 ENCOUNTER — Other Ambulatory Visit: Payer: Self-pay | Admitting: Family Medicine

## 2023-12-08 NOTE — Telephone Encounter (Deleted)
 Medication: Allopurinol  (Zyloprim ) 300 mg  Directions: Take 1 tablet by mouth daily  Last given:  Number refills:  Last o/v:  Follow up:  Labs:   Medication: Gabapentin  (Neurontin ) 300 mg  Directions: Take 1 tablet by mouth 4 times a day Last given:  Number refills:  Last o/v:  Follow up:  Labs:

## 2023-12-08 NOTE — Telephone Encounter (Signed)
 Called the number given by patient for the pharmacy and spoke with Abraham Hoffmann. I asked if [facility] is the same as what it showing in patient's chart as [facility]. She stated that they are the same that the name is now [facility]. I also asked if she can tell me if there is a Rx for Allopurinol  (Zyloprim ) 300 mg showing being sent on 09/12/23 as a 90 supply with 3 refills? She said that she see's it and not sure why it was processed and stated that she will get it processed for send out and have a pharmacist give patient a call once it's ready for send out. I thanked her for taking my call.

## 2023-12-08 NOTE — Telephone Encounter (Signed)
 Called patient to inform him of what was said about the allopurinol  300 mg and the gabapentin  300 mg 4 times a day. He thanked me for calling and asked did I update his pharmacy so that any future refills do not go to the local pharmacy. I informed him that I did add the mail order pharmacy as his main choice with the local being second choice. He thanked me for calling with an update about his medications.

## 2023-12-09 NOTE — Telephone Encounter (Signed)
 Requesting: gabapentin  (NEURONTIN ) 300 MG capsule  Last Visit: 11/08/2023 Next Visit: 03/19/2024 Last Refill: 1/8/20217 by Historical Provider  Please Advise

## 2023-12-11 MED ORDER — GABAPENTIN 300 MG PO CAPS
300.0000 mg | ORAL_CAPSULE | Freq: Four times a day (QID) | ORAL | 2 refills | Status: DC
  Filled 2023-12-11: qty 120, 30d supply, fill #0

## 2023-12-12 ENCOUNTER — Other Ambulatory Visit (HOSPITAL_COMMUNITY): Payer: Self-pay

## 2023-12-14 ENCOUNTER — Other Ambulatory Visit: Payer: Self-pay | Admitting: Interventional Radiology

## 2023-12-14 DIAGNOSIS — M179 Osteoarthritis of knee, unspecified: Secondary | ICD-10-CM

## 2023-12-27 DIAGNOSIS — M25561 Pain in right knee: Secondary | ICD-10-CM | POA: Diagnosis not present

## 2023-12-27 DIAGNOSIS — M25562 Pain in left knee: Secondary | ICD-10-CM | POA: Diagnosis not present

## 2023-12-29 ENCOUNTER — Other Ambulatory Visit (HOSPITAL_COMMUNITY): Payer: Self-pay | Admitting: Student

## 2024-01-02 NOTE — Progress Notes (Signed)
 Chief Complaint: Patient was seen in consultation today for bilateral knee pain.   Referring Physician(s): Nikos Anglemyer J  History of Present Illness: Corey Hawkins is a 68 y.o. male with a medical history significant for HTN, pancreatitis, arthritis and chronic back pain. 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  He also has a history of bilateral knee pain and he presents today to discuss geniculate artery embolization.    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 He does well with versed  prior to receiving anesthesia   GERD (gastroesophageal reflux disease)    Headache    w/ trigeminal neuralgia flare-ups   History of kidney stones    in high school   Hypertension    Pancreatitis    Skin cancer    Trigeminal neuralgia     Past Surgical History:  Procedure Laterality Date   BUNIONECTOMY Left    EXCISION MORTON'S NEUROMA Left    KNEE SURGERY     ACL repair and meniscectomy with revisions. Four surgeries total.   LUMBAR LAMINECTOMY/DECOMPRESSION MICRODISCECTOMY Bilateral 11/24/2023   Procedure: BILATERAL LUMBAR THREE-FOUR LAMINECTOMY,FORAMINOTOMY; LEFT LUMBAR FIVE-SACRAL ONE DISCECTOMY;  Surgeon: Garry Kansas, MD;  Location: [facility] OR;  Service: Neurosurgery;  Laterality: Bilateral;   POSTERIOR FUSION CERVICAL SPINE     C3-4   SPINE SURGERY  2010   Cervical fusion    Allergies: Vibramycin [doxycycline hyclate] and Penicillins  Medications: Prior to Admission medications   Medication Sig Start Date End Date Taking? Authorizing Provider  allopurinol  (ZYLOPRIM ) 300 MG tablet Take 1 tablet (300 mg total) by mouth daily. 09/12/23   Graig Lawyer, MD  Ascorbic Acid (VITAMIN  C) 1000 MG tablet Take 1,000 mg by mouth daily as needed (cold symptoms).    [provider]  b complex vitamins capsule Take 1 capsule by mouth daily.    [provider]  Cholecalciferol 125 MCG (5000 UT) capsule Take 5,000 Units by mouth daily. 06/11/20   [provider]  colchicine  0.6 MG tablet Take 0.6 mg by mouth daily as needed (gout attacks). 11/13/19   [provider]  cyclobenzaprine  (FLEXERIL ) 10 MG tablet Take 10 mg by mouth at bedtime as needed for muscle spasms.    [provider]  diazepam  (VALIUM ) 5 MG tablet Take 1 tablet (5 mg total) by mouth every 8 (eight) hours as needed for muscle spasms. 08/17/15   Marilee Showers, MD  gabapentin  (NEURONTIN ) 300 MG capsule Take 1 capsule (300 mg total) by mouth 4 (four) times daily. 12/11/23   Webb, Padonda B, FNP  L-ARGININE PO Take 1 tablet by mouth daily.    [provider]  omeprazole (PRILOSEC) 20 MG capsule Take 20 mg by mouth daily.    [provider]  Potassium 99 MG TABS Take 99 mg by mouth daily.    [provider]  tadalafil  (CIALIS ) 20 MG tablet Take 1 tablet (20 mg total) by mouth daily. 09/06/23   Graig Lawyer, MD  traMADol  (ULTRAM ) 50 MG tablet 1-2 tablets every 6 hours as needed for pain 05/14/21   Everlyn Hockey, PA-C     Family History  Problem Relation Age  of Onset   Cancer Mother        Lung (non-smoker)   Gallbladder disease Mother    Heart disease Father    Gallbladder disease Father    Cancer Sister        Cervical   Gallbladder disease Brother    Cancer Maternal Uncle    Heart disease Maternal Grandmother    Diabetes Maternal Grandmother    Heart disease Maternal Grandfather    Heart disease Paternal Grandfather    Cancer Sister     Social History   Socioeconomic History   Marital status: Married    Spouse name: Not on file   Number of children: 3   Years of education: Not on file   Highest education level: Bachelor's degree  (e.g., BA, AB, BS)  Occupational History   Occupation: Sports administrator  Tobacco Use   Smoking status: Never    Passive exposure: Yes   Smokeless tobacco: Never  Vaping Use   Vaping status: Never Used  Substance and Sexual Activity   Alcohol use: Yes    Alcohol/week: 2.0 standard drinks of alcohol    Types: 2 Standard drinks or equivalent per week    Comment: 2 a week, not always   Drug use: No   Sexual activity: Not on file  Other Topics Concern   Not on file  Social History Narrative   Not on file   Social Drivers of Health   Financial Resource Strain: Low Risk  (08/26/2023)   Overall Financial Resource Strain (CARDIA)    Difficulty of Paying Living Expenses: Not hard at all  Food Insecurity: No Food Insecurity (11/24/2023)   Hunger Vital Sign    Worried About Running Out of Food in the Last Year: Never true    Ran Out of Food in the Last Year: Never true  Transportation Needs: No Transportation Needs (11/24/2023)   PRAPARE - Administrator, Civil Service (Medical): No    Lack of Transportation (Non-Medical): No  Physical Activity: Sufficiently Active (08/26/2023)   Exercise Vital Sign    Days of Exercise per Week: 5 days    Minutes of Exercise per Session: 60 min  Stress: No Stress Concern Present (08/26/2023)   [employer] of Occupational Health - Occupational Stress Questionnaire    Feeling of Stress : Not at all  Social Connections: Socially Integrated (11/24/2023)   Social Connection and Isolation Panel [NHANES]    Frequency of Communication with Friends and Family: More than three times a week    Frequency of Social Gatherings with Friends and Family: Once a week    Attends Religious Services: More than 4 times per year    Active Member of [employer] or Organizations: Yes    Attends Engineer, structural: More than 4 times per year    Marital Status: Married     Review of Systems: A 12 point ROS discussed and pertinent positives are indicated  in the HPI above.  All other systems are negative.  Review of Systems  Vital Signs: There were no vitals taken for this visit.  Advance Care Plan: The advanced care plan/surrogate decision maker was discussed at the time of visit and documented in the medical record.    Physical Exam  Imaging: No results found.  Labs:  CBC: Recent Labs    11/11/23 1430  WBC 11.7*  HGB 15.2  HCT 45.7  PLT 249    COAGS: No results for input(s): "INR", "APTT" in the  last 8760 hours.  BMP: Recent Labs    11/08/23 1001  NA 141  K 4.5  CL 102  CO2 29  GLUCOSE 100*  BUN 22  CALCIUM 9.6  CREATININE 0.93    LIVER FUNCTION TESTS: Recent Labs    11/08/23 1001  BILITOT 1.1  AST 19  ALT 20  ALKPHOS 79  PROT 6.7  ALBUMIN 4.4    TUMOR MARKERS: No results for input(s): "AFPTM", "CEA", "CA199", "CHROMGRNA" in the last 8760 hours.  Assessment and Plan:  68 year old male with a history of bilateral knee pain.   Thank you for this interesting consult.  I greatly enjoyed meeting Corey Hawkins and look forward to participating in their care.  A copy of this report was sent to the requesting provider on this date.   Creasie Doctor, MD Pager: [phone]    I spent a total of  40 Minutes   in face to face in clinical consultation, greater than 50% of which was counseling/coordinating care for bilateral knee pain.

## 2024-01-03 DIAGNOSIS — M25562 Pain in left knee: Secondary | ICD-10-CM | POA: Diagnosis not present

## 2024-01-04 ENCOUNTER — Ambulatory Visit
Admission: RE | Admit: 2024-01-04 | Discharge: 2024-01-04 | Disposition: A | Discharge status: Home / Self Care | Source: Ambulatory Visit | Attending: Interventional Radiology | Admitting: Interventional Radiology

## 2024-01-04 DIAGNOSIS — M25562 Pain in left knee: Secondary | ICD-10-CM | POA: Diagnosis not present

## 2024-01-04 DIAGNOSIS — M25561 Pain in right knee: Secondary | ICD-10-CM | POA: Diagnosis not present

## 2024-01-04 DIAGNOSIS — M179 Osteoarthritis of knee, unspecified: Secondary | ICD-10-CM

## 2024-01-04 HISTORY — PX: IR RADIOLOGIST EVAL & MGMT: IMG5224

## 2024-01-11 ENCOUNTER — Other Ambulatory Visit: Payer: Self-pay | Admitting: Interventional Radiology

## 2024-01-11 DIAGNOSIS — L570 Actinic keratosis: Secondary | ICD-10-CM | POA: Diagnosis not present

## 2024-01-11 DIAGNOSIS — L821 Other seborrheic keratosis: Secondary | ICD-10-CM | POA: Diagnosis not present

## 2024-01-11 DIAGNOSIS — M25562 Pain in left knee: Secondary | ICD-10-CM

## 2024-01-11 DIAGNOSIS — M1712 Unilateral primary osteoarthritis, left knee: Secondary | ICD-10-CM

## 2024-01-16 ENCOUNTER — Telehealth: Payer: Self-pay

## 2024-01-16 NOTE — Discharge Instructions (Addendum)
 Discharge Instructions for Genicular Artery Embolization (GAE)     Rest for the remainder of the day.  Avoid strenuous activities and heavy lifting for 48 hours. Gradually resume normal activities as tolerated.   You may experience mild pain or discomfort at the catheter insertion site or in the knee. This is normal. Use over-the-counter pain relievers such as acetaminophen  (Tylenol ) or ibuprofen  (Advil ) as directed. Apply an ice pack to the knee for 15-20 minutes every 2-3 hours to reduce swelling and discomfort.   Keep the catheter insertion site clean and dry. Remove the bandage after 24 hours and replace it with a clean, dry bandage if needed. Avoid soaking in baths, hot tubs, or swimming pools for 5 days. Showers are allowed.   You may resume your regular medications.  Resume your normal diet. Drink plenty of fluids to stay hydrated.   Call our office at [phone] with any concerns, including if you experience increased pain, swelling, warmth, redness, or drainage at the insertion site, if you have a fever over 100.68F (38C), or if you experience sudden weakness or numbness in the treated leg.   Please ensure you follow these instructions carefully and reach out to your healthcare provider if you have any concerns or questions. Wishing you a smooth and speedy recovery!    If you need to speak to someone after hours or on the weekend, please call the Interventional Radiologist on-call service at [phone]. Tell them you are a patient of Dr. Aleen Huron and that you had a GAE today, along with any issues you are having.    Thank you for visiting [facility]!

## 2024-01-16 NOTE — Telephone Encounter (Signed)
 Called and spoke with patient to remind him to be here at 0945 this Friday, 01/20/24, to be NPO after midnight except small sips to take his Gabapentin  for trigeminal neuralgia and to have a driver. I confirmed his pharmacy, but then he told me he cannot take oral steroids as it causes pancreatitis, so I did NOT send in Rx for post-procedure dosepak. He states he CAN tolerate IV steroids.

## 2024-01-20 ENCOUNTER — Ambulatory Visit
Admission: RE | Admit: 2024-01-20 | Discharge: 2024-01-20 | Disposition: A | Discharge status: Home / Self Care | Source: Ambulatory Visit | Attending: Interventional Radiology | Admitting: Interventional Radiology

## 2024-01-20 DIAGNOSIS — M1712 Unilateral primary osteoarthritis, left knee: Secondary | ICD-10-CM | POA: Diagnosis not present

## 2024-01-20 DIAGNOSIS — M25562 Pain in left knee: Secondary | ICD-10-CM

## 2024-01-20 DIAGNOSIS — I798 Other disorders of arteries, arterioles and capillaries in diseases classified elsewhere: Secondary | ICD-10-CM | POA: Diagnosis not present

## 2024-01-20 HISTORY — PX: IR EMBO ARTERIAL NOT HEMORR HEMANG INC GUIDE ROADMAPPING: IMG5448

## 2024-01-20 MED ORDER — KETOROLAC TROMETHAMINE 30 MG/ML IJ SOLN
30.0000 mg | Freq: Once | INTRAMUSCULAR | Status: AC
  Administered 2024-01-20: 30 mg via INTRAVENOUS

## 2024-01-20 MED ORDER — ACETAMINOPHEN 10 MG/ML IV SOLN
1000.0000 mg | Freq: Once | INTRAVENOUS | Status: AC
  Administered 2024-01-20: 1000 mg via INTRAVENOUS

## 2024-01-20 MED ORDER — MIDAZOLAM HCL 2 MG/2ML IJ SOLN: 1.0000 mg | INTRAMUSCULAR | Status: DC | PRN

## 2024-01-20 MED ORDER — MIDAZOLAM HCL 2 MG/2ML IJ SOLN: INTRAMUSCULAR | Status: DC | PRN

## 2024-01-20 MED ORDER — IIOPAMIDOL (ISOVUE-250) INJECTION 51%
100.0000 mL | Freq: Once | INTRAVENOUS | Status: AC | PRN
  Administered 2024-01-20: 60 mL via INTRA_ARTERIAL

## 2024-01-20 MED ORDER — NITROGLYCERIN 1 MG/10 ML FOR IR/CATH LAB
100.0000 ug | INTRA_ARTERIAL | Status: DC | PRN
  Administered 2024-01-20: 100 ug via INTRA_ARTERIAL

## 2024-01-20 MED ORDER — FENTANYL CITRATE (PF) 100 MCG/2ML IJ SOLN
INTRAMUSCULAR | Status: DC | PRN
  Administered 2024-01-20: 50 ug via INTRAVENOUS

## 2024-01-20 MED ORDER — FENTANYL CITRATE PF 50 MCG/ML IJ SOSY: 25.0000 ug | PREFILLED_SYRINGE | INTRAMUSCULAR | Status: DC | PRN

## 2024-01-20 MED ORDER — LIDOCAINE-EPINEPHRINE 1 %-1:100000 IJ SOLN
10.0000 mL | Freq: Once | INTRAMUSCULAR | Status: AC
  Administered 2024-01-20: 10 mL via INTRADERMAL

## 2024-01-20 MED ORDER — DEXAMETHASONE SODIUM PHOSPHATE 10 MG/ML IJ SOLN
10.0000 mg | Freq: Once | INTRAMUSCULAR | Status: AC
  Administered 2024-01-20: 10 mg via INTRAVENOUS

## 2024-01-20 MED ORDER — SODIUM CHLORIDE 0.9 % IV SOLN
INTRAVENOUS | Status: DC
  Administered 2024-01-20: 250 mL via INTRAVENOUS

## 2024-01-20 NOTE — H&P (Signed)
 [facility]  Assessment and Plan: Left knee osteoarthritis  Patient with long-standing left knee pain after injury and surgical repair despite several steroids injections with orthopedics.  He is interested in pursuing minimally invasive, non-surgical procedure to improve knee pain and was referred to Interventional Radiology by Dr. Silver Dross.  After discussion with Dr. Jinx Mourning 01/04/24 regarding geniculate artery embolization he has elected to proceed.  He has been NPO today.  His wife is available for post-procedure care and transportation.   The Risks and benefits of embolization were discussed with the patient including, but not limited to bleeding, infection, vascular injury, post operative pain, or contrast induced renal failure.  This procedure involves the use of X-rays and because of the nature of the planned procedure, it is possible that we will have prolonged use of X-ray fluoroscopy.  Potential radiation risks to you include (but are not limited to) the following: - A slightly elevated risk for cancer several years later in life. This risk is typically less than 0.5% percent. This risk is low in comparison to the normal incidence of human cancer, which is 33% for women and 50% for men according to the American Cancer Society. - Radiation induced injury can include skin redness, resembling a rash, tissue breakdown / ulcers and hair loss (which can be temporary or permanent).   The likelihood of either of these occurring depends on the difficulty of the procedure and whether you are sensitive to radiation due to previous procedures, disease, or genetic conditions.   IF your procedure requires a prolonged use of radiation, you will be notified and given written instructions for further action.  It is your responsibility to monitor the irradiated area for the 2 weeks following the procedure and to notify your physician if you are concerned that  you have suffered a radiation induced injury.    All of the patient's questions were answered, patient is agreeable to proceed. Consent signed and in chart.  ______________________________________________________________________   History of Present Illness: Corey Hawkins is a 68 y.o. male with a medical history significant for HTN, pancreatitis, arthritis and chronic back pain. 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 He has bilateral knee pain which is worse on the left after several year history of surgery and steroid injections.  He was referred to Interventional Radiology by Dr. Silver Dross to discuss merits of geniculate artery embolization.  He met with Dr. Jinx Mourning in consultation and has elected to proceed.  He presents today for procedure in his usual state of health.  Denies concerns or complaints today.   Allergies and medications reviewed.   Review of Systems: A 12 point ROS discussed and pertinent positives are indicated in the HPI above.  All other systems are negative.  Review of Systems  Constitutional:  Negative for fatigue and fever.  Respiratory:  Negative for cough and shortness of breath.   Cardiovascular:  Negative for chest pain.  Gastrointestinal:  Negative for abdominal pain, nausea and vomiting.  Musculoskeletal:  Positive for arthralgias (bilateral knee). Negative for back pain.  Psychiatric/Behavioral:  Negative for behavioral problems and confusion.     Vital Signs: BP 137/81 (BP Location: Left Arm, Patient Position: Bed low/side rails up, Cuff Size: Normal)   Pulse 64   Temp 97.9 F (36.6 C) (Oral)   Resp 13   SpO2 96%   Physical Exam Vitals and nursing note reviewed.  Constitutional:      General: He is not in acute distress.    Appearance: Normal appearance. He is not ill-appearing.  HENT:     Mouth/Throat:     Mouth:  Mucous membranes are moist.     Pharynx: Oropharynx is clear.   Cardiovascular:     Rate and Rhythm: Normal rate and regular rhythm.  Pulmonary:     Effort: Pulmonary effort is normal.     Breath sounds: Normal breath sounds.  Abdominal:     General: Abdomen is flat. There is no distension.   Skin:    General: Skin is warm and dry.   Neurological:     General: No focal deficit present.     Mental Status: He is alert and oriented to person, place, and time. Mental status is at baseline.   Psychiatric:        Mood and Affect: Mood normal.        Behavior: Behavior normal.        Thought Content: Thought content normal.        Judgment: Judgment normal.      Imaging reviewed.   Labs:  COAGS: No results for input(s): INR, APTT in the last 8760 hours.  BMP: Recent Labs    11/08/23 1001  NA 141  K 4.5  CL 102  CO2 29  GLUCOSE 100*  BUN 22  CALCIUM 9.6  CREATININE 0.93       Electronically Signed: Lillian Rein, PA 01/20/2024, 10:31 AM   I spent a total of 15 minutes in face to face in clinical consultation, greater than 50% of which was counseling/coordinating care for persistent left knee pain.

## 2024-01-20 NOTE — Procedures (Signed)
 Interventional Radiology Procedure Note  Procedure: Left geniculate artery embolization   Findings: Please refer to procedural dictation for full description. Left proximal SFA access, 4 Fr.  Manual compression for hemostasis.  Complications: None immediate  Estimated Blood Loss: < 5 ml  Recommendations: IR will arrange 1 month outpatient follow up.   Creasie Doctor, MD

## 2024-01-23 ENCOUNTER — Telehealth: Payer: Self-pay

## 2024-01-23 NOTE — Telephone Encounter (Signed)
  Phone call to patient to follow up from his GAE on 01/20/24. He reports that he has no knee pain or swelling. He denies any signs of bleeding, infection, redness at the right femoral site, drainage at the site, fever, nausea, or vomiting. Dr. Jinx Mourning will call him within the month to check on his status post-procedure. He was advised to call back if anything were to change or any concerns arise and we will arrange an in person appointment. Patient verbalized understanding.

## 2024-02-15 ENCOUNTER — Other Ambulatory Visit: Payer: Self-pay | Admitting: Interventional Radiology

## 2024-02-15 DIAGNOSIS — M1712 Unilateral primary osteoarthritis, left knee: Secondary | ICD-10-CM

## 2024-03-19 ENCOUNTER — Ambulatory Visit

## 2024-03-27 DIAGNOSIS — M5127 Other intervertebral disc displacement, lumbosacral region: Secondary | ICD-10-CM | POA: Diagnosis not present

## 2024-03-27 DIAGNOSIS — Z6827 Body mass index (BMI) 27.0-27.9, adult: Secondary | ICD-10-CM | POA: Diagnosis not present

## 2024-03-27 DIAGNOSIS — M48061 Spinal stenosis, lumbar region without neurogenic claudication: Secondary | ICD-10-CM | POA: Diagnosis not present

## 2024-04-16 DIAGNOSIS — K080 Exfoliation of teeth due to systemic causes: Secondary | ICD-10-CM | POA: Diagnosis not present

## 2024-04-17 DIAGNOSIS — M47816 Spondylosis without myelopathy or radiculopathy, lumbar region: Secondary | ICD-10-CM | POA: Diagnosis not present

## 2024-04-17 DIAGNOSIS — M5127 Other intervertebral disc displacement, lumbosacral region: Secondary | ICD-10-CM | POA: Diagnosis not present

## 2024-04-18 ENCOUNTER — Other Ambulatory Visit (HOSPITAL_COMMUNITY): Payer: Self-pay

## 2024-04-26 DIAGNOSIS — M5417 Radiculopathy, lumbosacral region: Secondary | ICD-10-CM | POA: Diagnosis not present

## 2024-05-31 DIAGNOSIS — K080 Exfoliation of teeth due to systemic causes: Secondary | ICD-10-CM | POA: Diagnosis not present

## 2024-06-06 DIAGNOSIS — K080 Exfoliation of teeth due to systemic causes: Secondary | ICD-10-CM | POA: Diagnosis not present

## 2024-06-07 ENCOUNTER — Telehealth: Payer: Self-pay | Admitting: Family Medicine

## 2024-06-07 NOTE — Telephone Encounter (Signed)
 I called pt to scheduled an AWV, he declined, I did offer by phone.

## 2024-06-07 NOTE — Telephone Encounter (Signed)
 Copied from CRM [id number]. Topic: General - Call Back - No Documentation >> Jun 07, 2024 12:36 PM Corey Hawkins wrote: Reason for CRM: Pt said he received a call from someone named Grayce. Didn't see any documentation of call.

## 2024-06-08 DIAGNOSIS — Z6827 Body mass index (BMI) 27.0-27.9, adult: Secondary | ICD-10-CM | POA: Diagnosis not present

## 2024-06-08 DIAGNOSIS — M5416 Radiculopathy, lumbar region: Secondary | ICD-10-CM | POA: Diagnosis not present

## 2024-06-08 NOTE — Telephone Encounter (Signed)
 Documented on worksheet   there was a CRM [phone]

## 2024-06-12 DIAGNOSIS — L570 Actinic keratosis: Secondary | ICD-10-CM | POA: Diagnosis not present

## 2024-06-12 DIAGNOSIS — Z08 Encounter for follow-up examination after completed treatment for malignant neoplasm: Secondary | ICD-10-CM | POA: Diagnosis not present

## 2024-06-12 DIAGNOSIS — L905 Scar conditions and fibrosis of skin: Secondary | ICD-10-CM | POA: Diagnosis not present

## 2024-06-12 DIAGNOSIS — Z86008 Personal history of in-situ neoplasm of other site: Secondary | ICD-10-CM | POA: Diagnosis not present

## 2024-06-12 DIAGNOSIS — L820 Inflamed seborrheic keratosis: Secondary | ICD-10-CM | POA: Diagnosis not present

## 2024-06-13 DIAGNOSIS — M5416 Radiculopathy, lumbar region: Secondary | ICD-10-CM | POA: Diagnosis not present

## 2024-07-02 DIAGNOSIS — M47816 Spondylosis without myelopathy or radiculopathy, lumbar region: Secondary | ICD-10-CM | POA: Diagnosis not present

## 2024-07-02 DIAGNOSIS — M5417 Radiculopathy, lumbosacral region: Secondary | ICD-10-CM | POA: Diagnosis not present

## 2024-07-02 DIAGNOSIS — M5126 Other intervertebral disc displacement, lumbar region: Secondary | ICD-10-CM | POA: Diagnosis not present

## 2024-07-08 ENCOUNTER — Other Ambulatory Visit: Payer: Self-pay | Admitting: Family Medicine

## 2024-07-16 ENCOUNTER — Encounter: Payer: Self-pay | Admitting: Family Medicine

## 2024-07-17 DIAGNOSIS — M5417 Radiculopathy, lumbosacral region: Secondary | ICD-10-CM | POA: Diagnosis not present

## 2024-07-17 MED ORDER — ALLOPURINOL 300 MG PO TABS: 300.0000 mg | ORAL_TABLET | Freq: Every day | ORAL | 3 refills | Status: AC

## 2024-07-17 MED ORDER — GABAPENTIN 300 MG PO CAPS: 300.0000 mg | ORAL_CAPSULE | Freq: Four times a day (QID) | ORAL | 2 refills | Status: DC

## 2024-07-17 NOTE — Telephone Encounter (Signed)
 New mail order insurance as of 08/09/2024.  Dm/cma

## 2024-07-18 NOTE — Telephone Encounter (Signed)
 Patient stating he was changed to Gabapentin  600 mg.   Please review and advise.  Thanks. Dm/cma

## 2024-07-19 NOTE — Telephone Encounter (Signed)
 Been taking the one 600 mg 4xday

## 2024-07-20 MED ORDER — GABAPENTIN 600 MG PO TABS: 600.0000 mg | ORAL_TABLET | Freq: Four times a day (QID) | ORAL | 3 refills | Status: AC

## 2024-07-20 NOTE — Addendum Note (Signed)
 Addended by: THEDORA GARNETTE HERO on: 07/20/2024 09:11 AM   Modules accepted: Orders

## 2024-08-29 ENCOUNTER — Encounter: Payer: Self-pay | Admitting: Family Medicine

## 2024-08-29 ENCOUNTER — Ambulatory Visit: Payer: Self-pay | Admitting: Family Medicine

## 2024-08-29 ENCOUNTER — Ambulatory Visit: Admitting: Family Medicine

## 2024-08-29 VITALS — BP 136/78 | HR 78 | Temp 98.1°F | Ht 71.0 in | Wt 193.0 lb

## 2024-08-29 DIAGNOSIS — M5127 Other intervertebral disc displacement, lumbosacral region: Secondary | ICD-10-CM

## 2024-08-29 DIAGNOSIS — M5126 Other intervertebral disc displacement, lumbar region: Secondary | ICD-10-CM | POA: Insufficient documentation

## 2024-08-29 DIAGNOSIS — I10 Essential (primary) hypertension: Secondary | ICD-10-CM

## 2024-08-29 DIAGNOSIS — E785 Hyperlipidemia, unspecified: Secondary | ICD-10-CM | POA: Diagnosis not present

## 2024-08-29 DIAGNOSIS — H348122 Central retinal vein occlusion, left eye, stable: Secondary | ICD-10-CM

## 2024-08-29 DIAGNOSIS — H269 Unspecified cataract: Secondary | ICD-10-CM | POA: Insufficient documentation

## 2024-08-29 LAB — LIPID PANEL
Cholesterol: 182 mg/dL (ref 28–200)
HDL: 68.6 mg/dL
LDL Cholesterol: 97 mg/dL (ref 10–99)
NonHDL: 113.46
Total CHOL/HDL Ratio: 3
Triglycerides: 84 mg/dL (ref 10.0–149.0)
VLDL: 16.8 mg/dL (ref 0.0–40.0)

## 2024-08-29 LAB — COMPREHENSIVE METABOLIC PANEL WITH GFR
ALT: 15 U/L (ref 3–53)
AST: 20 U/L (ref 5–37)
Albumin: 4.2 g/dL (ref 3.5–5.2)
Alkaline Phosphatase: 74 U/L (ref 39–117)
BUN: 22 mg/dL (ref 6–23)
CO2: 32 meq/L (ref 19–32)
Calcium: 9.9 mg/dL (ref 8.4–10.5)
Chloride: 103 meq/L (ref 96–112)
Creatinine, Ser: 1.02 mg/dL (ref 0.40–1.50)
GFR: 74.84 mL/min
Glucose, Bld: 104 mg/dL — ABNORMAL HIGH (ref 70–99)
Potassium: 4.8 meq/L (ref 3.5–5.1)
Sodium: 141 meq/L (ref 135–145)
Total Bilirubin: 1 mg/dL (ref 0.2–1.2)
Total Protein: 6.9 g/dL (ref 6.0–8.3)

## 2024-08-29 LAB — HEMOGLOBIN A1C: Hgb A1c MFr Bld: 5.4 % (ref 4.6–6.5)

## 2024-08-29 NOTE — Assessment & Plan Note (Signed)
 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

## 2024-08-29 NOTE — Progress Notes (Signed)
 " [facility] [address] [place] [address] Dept: [phone] Dept Fax: [phone]  Chronic Care Office Visit  Subjective:    Patient ID: Corey Hawkins, male    DOB: Sep 03, 1955, 69 y.o..   MRN: [id number]  Chief Complaint  Patient presents with   Follow-up    F/u meds.  Fasting today.    History of Present Illness:  Patient is in today for reassessment of chronic medical conditions.   Corey Hawkins has a history of hypertension. Currently he has not needed medication. He has had an occasional surge in his BP related to his back pain issues.  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 He is scheduled to see his retinal specialist. His eye doctor recommended he have labs to screen for potential underlying causes of his eye condition.   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 The laminectomy helped, but the discectomy was not successful at relieving his radiculopathy. He has been receiving ESI injections. He is managed on tramadol  50 mg 1-2 tabs every 6 hours as needed, gabapentin  900 mg four times a day, and cyclobenzaprine  10 mg at bedtime.   Corey Hawkins has osteoarthritis of the left knee. He notes that he has been offered knee joint replacement. He has been trying to resolve his back issue before having this surgery.  Corey Hawkins asks about having a renewal of a prescription he has for ivermectin. He took this during the COVID pandemic and feels this helped to keep him healthy. He now takes this when he travels, such as a trip last year to [place]. He took it then in response to feeling fever/chills and generally sick. He feels this helped him overcome that illness quickly.  Past Medical History: Patient Active  Problem List   Diagnosis Date Noted   Recurrent herniation of lumbar disc 08/29/2024   Spinal stenosis of lumbar region with neurogenic claudication 11/24/2023   Herniated nucleus pulposus of lumbosacral region 11/08/2023   Lumbosacral radiculopathy 11/08/2023   Knee osteoarthritis 11/08/2023   Right rotator cuff tear 11/08/2023   History of melanoma 11/08/2023   History of detached retina repair 11/08/2023   Trigeminal neuralgia of left side of face 11/08/2023   Aortic atherosclerosis 08/30/2023   History of pancreatitis 08/30/2023   Borderline hyperlipidemia 08/30/2023   Fatty liver 08/30/2023   Focal nodular hyperplasia of liver 08/30/2023   Erectile dysfunction 08/30/2023   Spinal stenosis of lumbar region 03/21/2020   Chronic low back pain 03/21/2020   Lumbar spondylosis 03/21/2020   Essential hypertension 08/14/2015   GERD (gastroesophageal reflux disease) 08/14/2015   Gout 08/14/2015   Past Surgical History:  Procedure Laterality Date   BUNIONECTOMY Left    EXCISION MORTON'S NEUROMA Left    IR EMBO ARTERIAL NOT HEMORR HEMANG INC GUIDE ROADMAPPING  01/20/2024   IR RADIOLOGIST EVAL & MGMT  01/04/2024   KNEE SURGERY     ACL repair and meniscectomy with revisions. Four surgeries total.   LUMBAR LAMINECTOMY/DECOMPRESSION MICRODISCECTOMY Bilateral 11/24/2023   Procedure: BILATERAL LUMBAR THREE-FOUR LAMINECTOMY,FORAMINOTOMY; LEFT LUMBAR FIVE-SACRAL ONE DISCECTOMY;  Surgeon: Mavis Purchase, MD;  Location: [facility] OR;  Service: Neurosurgery;  Laterality: Bilateral;   POSTERIOR FUSION CERVICAL SPINE     C3-4  SPINE SURGERY  2010   Cervical fusion   Family History  Problem Relation Age of Onset   Cancer Mother        Lung (non-smoker)   Gallbladder disease Mother    Heart disease Father    Gallbladder disease Father    Cancer Sister        Cervical   Gallbladder disease Brother    Cancer Maternal Uncle    Heart disease Maternal Grandmother    Diabetes Maternal Grandmother     Heart disease Maternal Grandfather    Heart disease Paternal Grandfather    Cancer Sister    Outpatient Medications Prior to Visit  Medication Sig Dispense Refill   allopurinol  (ZYLOPRIM ) 300 MG tablet Take 1 tablet (300 mg total) by mouth daily. 90 tablet 3   Ascorbic Acid (VITAMIN C) 1000 MG tablet Take 1,000 mg by mouth daily as needed (cold symptoms).     b complex vitamins capsule Take 1 capsule by mouth daily.     Cholecalciferol 125 MCG (5000 UT) capsule Take 5,000 Units by mouth daily.     colchicine  0.6 MG tablet TAKE 1 TABLET BY MOUTH 1 TO 2 TIMES DAILY AS NEEDED FOR GOUT FLARE 30 tablet 0   cyclobenzaprine  (FLEXERIL ) 10 MG tablet Take 10 mg by mouth at bedtime as needed for muscle spasms.     diazepam  (VALIUM ) 5 MG tablet Take 1 tablet (5 mg total) by mouth every 8 (eight) hours as needed for muscle spasms. 15 tablet 0   gabapentin  (NEURONTIN ) 300 MG capsule Take 1 capsule (300 mg total) by mouth 4 (four) times daily. 120 capsule 2   gabapentin  (NEURONTIN ) 600 MG tablet Take 1 tablet (600 mg total) by mouth in the morning, at noon, in the evening, and at bedtime. 360 tablet 3   L-ARGININE PO Take 1 tablet by mouth daily.     omeprazole (PRILOSEC) 20 MG capsule Take 20 mg by mouth daily.     Potassium 99 MG TABS Take 99 mg by mouth daily.     tadalafil  (CIALIS ) 20 MG tablet Take 1 tablet (20 mg total) by mouth daily. 30 tablet 11   traMADol  (ULTRAM ) 50 MG tablet 1-2 tablets every 6 hours as needed for pain 20 tablet 0   No facility-administered medications prior to visit.   Allergies[1]   Objective:   Today's Vitals   08/29/24 0912  BP: 136/78  Pulse: 78  Temp: 98.1 F (36.7 C)  TempSrc: Temporal  SpO2: 98%  Weight: 193 lb (87.5 kg)  Height: 5' 11 (1.803 m)   Body mass index is 26.92 kg/m.   General: Well developed, well nourished. No acute distress. Psych: Alert and oriented. Normal mood and affect.  Health Maintenance Due  Topic Date Due   Hepatitis C  Screening  Never done   Zoster Vaccines- Shingrix (1 of 2) Never done   Medicare Annual Wellness (AWV)  10/26/2023   Influenza Vaccine  Never done   COVID-19 Vaccine (1 - 2025-26 season) Never done   Lab Results Lipid Panel:  Lab Results  Component Value Date   CHOL 196 11/08/2023   HDL 66.90 11/08/2023   LDLCALC 114 (H) 11/08/2023   TRIG 75.0 11/08/2023     Assessment & Plan:   Problem List Items Addressed This Visit       Cardiovascular and Mediastinum   Central retinal vein occlusion, left eye (HCC)   I will order a CMP and A1c to screen for potential  underlying contributors to his vein occlusion in his left eye. He willc otninue to follow-up with his retinal specialist.      Relevant Orders   Comprehensive metabolic panel with GFR   Hemoglobin A1c   Essential hypertension - Primary   Systolic blood pressure is mildly high today. We will continue to monitor this for now.        Musculoskeletal and Integument   Herniated nucleus pulposus of lumbosacral region- L5-S1   Continue to follow with neurosurgery regarding his ongoing radiculopathy associated with his herniated disc.      Relevant Orders   CT CARDIAC SCORING (SELF PAY ONLY)     Other   Borderline hyperlipidemia   I will reassess lipids today. Discussed his ACC/AHA CV risk of 16.9%. He is agreeable to proceed with a CAC scan to further delineate risk and consideration of a statin.      Relevant Orders   Lipid panel   I did discuss his use of ivermectin. I cannot ethically support prescribing this drug, esp. for the manner in which he is taking this.  Return in about 3 months (around 11/27/2024) for Annual preventative care.   Garnette CHRISTELLA Simpler, MD  I,Emily Lagle,acting as a scribe for Garnette CHRISTELLA Simpler, MD.,have documented all relevant documentation on the behalf of Garnette CHRISTELLA Simpler, MD.  I, Garnette CHRISTELLA Simpler, MD, have reviewed all documentation for this visit. The documentation on 08/29/2024 for the exam,  diagnosis, procedures, and orders are all accurate and complete.      [1]  Allergies Allergen Reactions   Vibramycin [Doxycycline Hyclate] Other (See Comments)    All mycins cause mouth sores, red man syndrome   Penicillins Rash    all 'cillins Has patient had a PCN reaction causing immediate rash, facial/tongue/throat swelling, SOB or lightheadedness with hypotension: Yes Has patient had a PCN reaction causing severe rash involving mucus membranes or skin necrosis: No Has patient had a PCN reaction that required hospitalization No Has patient had a PCN reaction occurring within the last 10 years: No If all of the above answers are NO, then may proceed with Cephalosporin use.    "

## 2024-08-29 NOTE — Assessment & Plan Note (Signed)
 I will order a CMP and A1c to screen for potential underlying contributors to his vein occlusion in his left eye. He willc otninue to follow-up with his retinal specialist.

## 2024-08-29 NOTE — Assessment & Plan Note (Addendum)
 I will reassess lipids today. Discussed his ACC/AHA CV risk of 16.9%. He is agreeable to proceed with a CAC scan to further delineate risk and consideration of a statin.

## 2024-08-29 NOTE — Assessment & Plan Note (Addendum)
 Systolic blood pressure is mildly high today. We will continue to monitor this for now.

## 2024-11-27 ENCOUNTER — Encounter: Admitting: Family Medicine

## 2024-11-28 ENCOUNTER — Other Ambulatory Visit (HOSPITAL_BASED_OUTPATIENT_CLINIC_OR_DEPARTMENT_OTHER)
